# Patient Record
Sex: Male | Born: 1990 | Race: Black or African American | Hispanic: No | Marital: Single | Smoking: Never smoker
Health system: Southern US, Community
[De-identification: ages and names within clinical notes are randomized; demographics above are authoritative.]

## PROBLEM LIST (undated history)

## (undated) HISTORY — PX: ANTERIOR CRUCIATE LIGAMENT REPAIR: SHX115

---
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
## 2011-04-07 LAB — STREP A DNA PROBE: Group A Strep Probe: NEGATIVE

## 2011-04-07 LAB — RAPID STREP SCREEN (MED CTR MEBANE ONLY): Streptococcus, Group A Screen (Direct): NEGATIVE

## 2013-04-11 ENCOUNTER — Encounter (HOSPITAL_COMMUNITY): Payer: Self-pay | Admitting: Emergency Medicine

## 2013-04-11 ENCOUNTER — Emergency Department (HOSPITAL_COMMUNITY)
Admission: EM | Admit: 2013-04-11 | Discharge: 2013-04-11 | Disposition: A | Discharge status: Home / Self Care | Payer: Self-pay | Attending: Emergency Medicine | Admitting: Emergency Medicine

## 2013-04-11 DIAGNOSIS — J069 Acute upper respiratory infection, unspecified: Secondary | ICD-10-CM

## 2013-04-11 DIAGNOSIS — K089 Disorder of teeth and supporting structures, unspecified: Secondary | ICD-10-CM | POA: Insufficient documentation

## 2013-04-11 DIAGNOSIS — K029 Dental caries, unspecified: Secondary | ICD-10-CM

## 2013-04-11 DIAGNOSIS — L299 Pruritus, unspecified: Secondary | ICD-10-CM | POA: Insufficient documentation

## 2013-04-11 MED ORDER — AMOXICILLIN 500 MG PO CAPS: 500.0000 mg | ORAL_CAPSULE | Freq: Three times a day (TID) | ORAL | Status: DC

## 2013-04-11 MED ORDER — PSEUDOEPHEDRINE HCL 60 MG PO TABS: 60.0000 mg | ORAL_TABLET | Freq: Four times a day (QID) | ORAL | Status: DC | PRN

## 2013-04-11 NOTE — ED Notes (Signed)
Awoke today with head congestion.  Also has tooth pain

## 2013-04-11 NOTE — ED Provider Notes (Signed)
CSN: [id number]     Arrival date & time 04/11/13  2132 History   First MD Initiated Contact with Patient 04/11/13 2138     Chief Complaint  Patient presents with  . Nasal Congestion   (Consider location/radiation/quality/duration/timing/severity/associated sxs/prior Treatment) Patient is a 22 y.o. male presenting with URI. The history is provided by the patient.  URI Presenting symptoms: congestion and cough   Presenting symptoms: no fever and no sore throat   Severity:  Mild Onset quality:  Gradual Duration:  12 hours Timing:  Constant Chronicity:  New Relieved by: rest helps some. Worsened by:  Eating Ineffective treatments:  None tried Associated symptoms: myalgias and sneezing   Associated symptoms: no headaches (hx of headache when younger but none in over a year.), no neck pain, no sinus pain and no wheezing   Risk factors: no diabetes mellitus and no sick contacts    AMOL DOMANSKI is a 22 y.o. male who presents to the ED with nasal congestion, cough and a tooth ache. The tooth makes his head hurt. There is a big hole in the right upper tooth and it hurts to ear.  History reviewed. No pertinent past medical history. History reviewed. No pertinent past surgical history. No family history on file. History  Substance Use Topics  . Smoking status: Never Smoker   . Smokeless tobacco: Not on file  . Alcohol Use: Yes     Comment: rarely    Review of Systems  Constitutional: Negative for fever and chills.  HENT: Positive for congestion, sneezing and dental problem. Negative for sore throat and neck pain.   Eyes: Positive for itching. Negative for photophobia and redness.  Respiratory: Positive for cough. Negative for chest tightness and wheezing.   Cardiovascular: Negative for palpitations.  Gastrointestinal: Negative for nausea, vomiting and abdominal pain.  Musculoskeletal: Positive for myalgias.  Skin: Negative for rash.  Allergic/Immunologic: Negative for  immunocompromised state.  Neurological: Negative for syncope and headaches (hx of headache when younger but none in over a year.).  Psychiatric/Behavioral: The patient is not nervous/anxious.     Allergies  Review of patient's allergies indicates no known allergies.  Home Medications   Current Outpatient Rx  Name  Route  Sig  Dispense  Refill  . ibuprofen (ADVIL,MOTRIN) 200 MG tablet   Oral   Take 200 mg by mouth every 6 (six) hours as needed for pain.          BP 118/79  Pulse 79  Temp(Src) 98.6 F (37 C) (Oral)  Resp 18  Ht 5\' 7"  (1.702 m)  Wt 152 lb (68.947 kg)  BMI 23.8 kg/m2  SpO2 99% Physical Exam  Nursing note and vitals reviewed. Constitutional: He is oriented to person, place, and time. He appears well-developed and well-nourished. No distress.  HENT:  Head: Normocephalic and atraumatic.  Right Ear: Tympanic membrane normal.  Left Ear: Tympanic membrane normal.  Nose: Rhinorrhea present.  Mouth/Throat: Uvula is midline, oropharynx is clear and moist and mucous membranes are normal. Dental caries present.    Eyes: EOM are normal.  Neck: Neck supple.  Cardiovascular: Normal rate and regular rhythm.   Pulmonary/Chest: Effort normal and breath sounds normal.  Abdominal: Soft. There is no tenderness.  Musculoskeletal: Normal range of motion.  Neurological: He is alert and oriented to person, place, and time. No cranial nerve deficit.  Skin: Skin is warm and dry.  Psychiatric: He has a normal mood and affect. His behavior is normal.    ED  Course  Procedures  MDM  22 y.o. male with URI and dental pain due to caries. Will treat URI with Sudafed and will treat dental infection with Amoxil. Patient to follow up with the dental clinic as soon as possible. He will return here as needed.    Medication List    TAKE these medications       amoxicillin 500 MG capsule  Commonly known as:  AMOXIL  Take 1 capsule (500 mg total) by mouth 3 (three) times daily.      pseudoephedrine 60 MG tablet  Commonly known as:  SUDAFED  Take 1 tablet (60 mg total) by mouth every 6 (six) hours as needed for congestion.      ASK your doctor about these medications       ibuprofen 200 MG tablet  Commonly known as:  ADVIL,MOTRIN  Take 400 mg by mouth every 6 (six) hours as needed for pain.           Janne Napoleon, [place] 04/11/13 [phone]

## 2013-04-11 NOTE — ED Provider Notes (Signed)
Medical screening examination/treatment/procedure(s) were performed by non-physician practitioner and as supervising physician I was immediately available for consultation/collaboration.    Vida Roller, MD 04/11/13 [phone]

## 2013-05-30 ENCOUNTER — Emergency Department (HOSPITAL_COMMUNITY)
Admission: EM | Admit: 2013-05-30 | Discharge: 2013-05-30 | Disposition: A | Discharge status: Home / Self Care | Payer: Self-pay | Attending: Emergency Medicine | Admitting: Emergency Medicine

## 2013-05-30 ENCOUNTER — Encounter (HOSPITAL_COMMUNITY): Payer: Self-pay | Admitting: Emergency Medicine

## 2013-05-30 DIAGNOSIS — R6884 Jaw pain: Secondary | ICD-10-CM | POA: Insufficient documentation

## 2013-05-30 DIAGNOSIS — T07XXXA Unspecified multiple injuries, initial encounter: Secondary | ICD-10-CM

## 2013-05-30 DIAGNOSIS — Z792 Long term (current) use of antibiotics: Secondary | ICD-10-CM | POA: Insufficient documentation

## 2013-05-30 DIAGNOSIS — IMO0002 Reserved for concepts with insufficient information to code with codable children: Secondary | ICD-10-CM | POA: Insufficient documentation

## 2013-05-30 MED ORDER — NAPROXEN 500 MG PO TABS: 500.0000 mg | ORAL_TABLET | Freq: Two times a day (BID) | ORAL | Status: DC
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
## 2013-05-30 NOTE — ED Notes (Signed)
Pt seen and evaluated by EDPa for initial assessment. 
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
## 2013-06-01 NOTE — ED Provider Notes (Signed)
Medical screening examination/treatment/procedure(s) were performed by non-physician practitioner and as supervising physician I was immediately available for consultation/collaboration.  EKG Interpretation   None         Keasha Malkiewicz L Bogdan Vivona, MD 06/01/13 0800 

## 2013-06-24 ENCOUNTER — Emergency Department (HOSPITAL_COMMUNITY)
Admission: EM | Admit: 2013-06-24 | Discharge: 2013-06-24 | Disposition: A | Discharge status: Home / Self Care | Payer: Self-pay | Attending: Emergency Medicine | Admitting: Emergency Medicine

## 2013-06-24 ENCOUNTER — Encounter (HOSPITAL_COMMUNITY): Payer: Self-pay | Admitting: Emergency Medicine

## 2013-06-24 DIAGNOSIS — R300 Dysuria: Secondary | ICD-10-CM | POA: Insufficient documentation

## 2013-06-24 DIAGNOSIS — R369 Urethral discharge, unspecified: Secondary | ICD-10-CM | POA: Insufficient documentation

## 2013-06-24 DIAGNOSIS — IMO0001 Reserved for inherently not codable concepts without codable children: Secondary | ICD-10-CM | POA: Insufficient documentation

## 2013-06-24 DIAGNOSIS — Z202 Contact with and (suspected) exposure to infections with a predominantly sexual mode of transmission: Secondary | ICD-10-CM | POA: Insufficient documentation

## 2013-06-24 DIAGNOSIS — K089 Disorder of teeth and supporting structures, unspecified: Secondary | ICD-10-CM | POA: Insufficient documentation

## 2013-06-24 DIAGNOSIS — K0889 Other specified disorders of teeth and supporting structures: Secondary | ICD-10-CM

## 2013-06-24 LAB — URINALYSIS, ROUTINE W REFLEX MICROSCOPIC
Bilirubin Urine: NEGATIVE
Glucose, UA: NEGATIVE mg/dL
Hgb urine dipstick: NEGATIVE
Ketones, ur: NEGATIVE mg/dL
Nitrite: NEGATIVE
Specific Gravity, Urine: >1.03 — ABNORMAL HIGH (ref 1.005–1.030)
Urobilinogen, UA: 0.2 mg/dL (ref 0.0–1.0)
pH: 5.5 (ref 5.0–8.0)

## 2013-06-24 LAB — RPR: RPR Ser Ql: NONREACTIVE

## 2013-06-24 LAB — URINE MICROSCOPIC-ADD ON

## 2013-06-24 MED ORDER — LIDOCAINE HCL (PF) 1 % IJ SOLN
INTRAMUSCULAR | Status: AC
  Administered 2013-06-24: 5 mL via INTRAMUSCULAR
  Filled 2013-06-24: qty 5

## 2013-06-24 MED ORDER — METRONIDAZOLE 500 MG PO TABS: 500.0000 mg | ORAL_TABLET | Freq: Three times a day (TID) | ORAL | Status: DC

## 2013-06-24 MED ORDER — AMOXICILLIN 500 MG PO CAPS: 500.0000 mg | ORAL_CAPSULE | Freq: Three times a day (TID) | ORAL | Status: DC

## 2013-06-24 MED ORDER — METRONIDAZOLE 500 MG PO TABS
500.0000 mg | ORAL_TABLET | Freq: Once | ORAL | Status: AC
  Administered 2013-06-24: 500 mg via ORAL
  Filled 2013-06-24: qty 1

## 2013-06-24 MED ORDER — PROCHLORPERAZINE MALEATE 5 MG PO TABS
10.0000 mg | ORAL_TABLET | Freq: Once | ORAL | Status: AC
  Administered 2013-06-24: 10 mg via ORAL
  Filled 2013-06-24: qty 2

## 2013-06-24 MED ORDER — TRAMADOL HCL 50 MG PO TABS: ORAL_TABLET | ORAL | Status: DC

## 2013-06-24 MED ORDER — AZITHROMYCIN 250 MG PO TABS
1000.0000 mg | ORAL_TABLET | Freq: Once | ORAL | Status: AC
  Administered 2013-06-24: 1000 mg via ORAL
  Filled 2013-06-24: qty 4

## 2013-06-24 MED ORDER — CEFTRIAXONE SODIUM 250 MG IJ SOLR
250.0000 mg | Freq: Once | INTRAMUSCULAR | Status: AC
  Administered 2013-06-24: 250 mg via INTRAMUSCULAR
  Filled 2013-06-24: qty 250

## 2013-06-24 NOTE — ED Notes (Signed)
Pt reports having painful discharge with urination this a.m.

## 2013-06-24 NOTE — ED Notes (Signed)
Pt noticed d/c when urinated last night.  Also says he has a tooth ache, rt upper molar for several mos.

## 2013-06-24 NOTE — ED Provider Notes (Signed)
CSN: [id number]     Arrival date & time 06/24/13  0941 History   First MD Initiated Contact with Patient 06/24/13 1018     Chief Complaint  Patient presents with  . SEXUALLY TRANSMITTED DISEASE   (Consider location/radiation/quality/duration/timing/severity/associated sxs/prior Treatment) HPI Comments: Two male friends told pt that he gave them trich. He Reports discharge with urination this AM.  Patient is a 22 y.o. male presenting with penile discharge. The history is provided by the patient.  Penile Discharge This is a new problem. The current episode started yesterday. The problem occurs constantly. The problem has been gradually worsening. Associated symptoms include myalgias. Pertinent negatives include no abdominal pain, arthralgias, chest pain, coughing, fever, neck pain, rash or vomiting. Exacerbated by: urination. He has tried nothing for the symptoms. The treatment provided moderate relief.    History reviewed. No pertinent past medical history. History reviewed. No pertinent past surgical history. History reviewed. No pertinent family history. History  Substance Use Topics  . Smoking status: Never Smoker   . Smokeless tobacco: Not on file  . Alcohol Use: No     Comment: rarely    Review of Systems  Constitutional: Negative for fever and activity change.       All ROS Neg except as noted in HPI  HENT: Negative for nosebleeds.   Eyes: Negative for photophobia and discharge.  Respiratory: Negative for cough, shortness of breath and wheezing.   Cardiovascular: Negative for chest pain and palpitations.  Gastrointestinal: Negative for vomiting, abdominal pain and blood in stool.  Genitourinary: Positive for discharge. Negative for dysuria, frequency and hematuria.  Musculoskeletal: Positive for myalgias. Negative for arthralgias, back pain and neck pain.  Skin: Negative.  Negative for rash.  Neurological: Negative for dizziness, seizures and speech difficulty.    Psychiatric/Behavioral: Negative for hallucinations and confusion.    Allergies  Review of patient's allergies indicates no known allergies.  Home Medications  No current outpatient prescriptions on file. BP 100/65  Pulse 70  Temp(Src) 98.3 F (36.8 C) (Oral)  Resp 18  Ht 5\' 7"  (1.702 m)  Wt 150 lb (68.04 kg)  BMI 23.49 kg/m2  SpO2 100% Physical Exam  Nursing note and vitals reviewed. Constitutional: He is oriented to person, place, and time. He appears well-developed and well-nourished.  Non-toxic appearance.  HENT:  Head: Normocephalic.  Right Ear: Tympanic membrane and external ear normal.  Left Ear: Tympanic membrane and external ear normal.  Mouth/Throat:    Eyes: EOM and lids are normal. Pupils are equal, round, and reactive to light.  Neck: Normal range of motion. Neck supple. Carotid bruit is not present.  Cardiovascular: Normal rate, regular rhythm, normal heart sounds, intact distal pulses and normal pulses.   Pulmonary/Chest: Breath sounds normal. No respiratory distress.  Abdominal: Soft. Bowel sounds are normal. There is no tenderness. There is no guarding. Hernia confirmed negative in the right inguinal area and confirmed negative in the left inguinal area.  Genitourinary: Testes normal. Circumcised. Discharge found.  Musculoskeletal: Normal range of motion.  Lymphadenopathy:       Head (right side): No submandibular adenopathy present.       Head (left side): No submandibular adenopathy present.    He has no cervical adenopathy.  Neurological: He is alert and oriented to person, place, and time. He has normal strength. No cranial nerve deficit or sensory deficit.  Skin: Skin is warm and dry.  Psychiatric: He has a normal mood and affect. His speech is normal.  ED Course  Procedures (including critical care time) Labs Review Labs Reviewed  URINALYSIS, ROUTINE W REFLEX MICROSCOPIC - Abnormal; Notable for the following:    Specific Gravity, Urine >1.030  (*)    Protein, ur TRACE (*)    Leukocytes, UA TRACE (*)    All other components within normal limits  URINE MICROSCOPIC-ADD ON   Imaging Review No results found.  EKG Interpretation   None       MDM  No diagnosis found. **I have reviewed nursing notes, vital signs, and all appropriate lab and imaging results for this patient.*  Urethral culture sent to the lab. Pt treated with Rocephin and zithromax and flagyl. Rx for tramadol and flagyl given to the patient. Dental resources given to pt. Pt [facility] with additional testing.  Kathie Dike, PA-C 06/24/13 2106

## 2013-06-25 LAB — GC/CHLAMYDIA PROBE AMP
CT Probe RNA: POSITIVE — AB
GC Probe RNA: NEGATIVE

## 2013-06-26 NOTE — ED Provider Notes (Signed)
Medical screening examination/treatment/procedure(s) were performed by non-physician practitioner and as supervising physician I was immediately available for consultation/collaboration.  EKG Interpretation   None         Drinda Belgard W. Dakisha Schoof, MD 06/26/13 1524 

## 2013-06-27 NOTE — ED Notes (Signed)
+   Chlamydia Patient treated with Rocephin And Zithromax-[facility] faxed 

## 2013-06-28 ENCOUNTER — Telehealth (HOSPITAL_COMMUNITY): Payer: Self-pay | Admitting: Emergency Medicine

## 2014-01-06 ENCOUNTER — Encounter (HOSPITAL_COMMUNITY): Payer: Self-pay | Admitting: Emergency Medicine

## 2014-01-06 ENCOUNTER — Emergency Department (HOSPITAL_COMMUNITY)
Admission: EM | Admit: 2014-01-06 | Discharge: 2014-01-06 | Disposition: A | Discharge status: Home / Self Care | Payer: Self-pay | Attending: Emergency Medicine | Admitting: Emergency Medicine

## 2014-01-06 DIAGNOSIS — Z792 Long term (current) use of antibiotics: Secondary | ICD-10-CM | POA: Insufficient documentation

## 2014-01-06 DIAGNOSIS — M25561 Pain in right knee: Secondary | ICD-10-CM

## 2014-01-06 DIAGNOSIS — W57XXXA Bitten or stung by nonvenomous insect and other nonvenomous arthropods, initial encounter: Secondary | ICD-10-CM

## 2014-01-06 DIAGNOSIS — G8929 Other chronic pain: Secondary | ICD-10-CM | POA: Insufficient documentation

## 2014-01-06 DIAGNOSIS — S90569A Insect bite (nonvenomous), unspecified ankle, initial encounter: Secondary | ICD-10-CM | POA: Insufficient documentation

## 2014-01-06 DIAGNOSIS — Z79899 Other long term (current) drug therapy: Secondary | ICD-10-CM | POA: Insufficient documentation

## 2014-01-06 DIAGNOSIS — Y939 Activity, unspecified: Secondary | ICD-10-CM | POA: Insufficient documentation

## 2014-01-06 DIAGNOSIS — Y929 Unspecified place or not applicable: Secondary | ICD-10-CM | POA: Insufficient documentation

## 2014-01-06 NOTE — ED Notes (Signed)
Pt reports ? Spider bite to back of rt heel. Small knot present to back of heel. C/o tenderness to touch.

## 2014-01-06 NOTE — Discharge Instructions (Signed)
Insect Bite Mosquitoes, flies, fleas, bedbugs, and many other insects can bite. Insect bites are different from insect stings. A sting is when venom is injected into the skin. Some insect bites can transmit infectious diseases. SYMPTOMS  Insect bites usually turn red, swell, and itch for 2 to 4 days. They often go away on their own. TREATMENT  Your caregiver may prescribe antibiotic medicines if a bacterial infection develops in the bite. HOME CARE INSTRUCTIONS  Do not scratch the bite area.  Keep the bite area clean and dry. Wash the bite area thoroughly with soap and water.  Put ice or cool compresses on the bite area.  Put ice in a plastic bag.  Place a towel between your skin and the bag.  Leave the ice on for 20 minutes, 4 times a day for the first 2 to 3 days, or as directed.  You may apply a baking soda paste, cortisone cream, or calamine lotion to the bite area as directed by your caregiver. This can help reduce itching and swelling.  Only take over-the-counter or prescription medicines as directed by your caregiver.  If you are given antibiotics, take them as directed. Finish them even if you start to feel better. You may need a tetanus shot if:  You cannot remember when you had your last tetanus shot.  You have never had a tetanus shot.  The injury broke your skin. If you get a tetanus shot, your arm may swell, get red, and feel warm to the touch. This is common and not a problem. If you need a tetanus shot and you choose not to have one, there is a rare chance of getting tetanus. Sickness from tetanus can be serious. SEEK IMMEDIATE MEDICAL CARE IF:   You have increased pain, redness, or swelling in the bite area.  You see a red line on the skin coming from the bite.  You have a fever.  You have joint pain.  You have a headache or neck pain.  You have unusual weakness.  You have a rash.  You have chest pain or shortness of breath.  You have abdominal pain,  nausea, or vomiting.  You feel unusually tired or sleepy. MAKE SURE YOU:   Understand these instructions.  Will watch your condition.  Will get help right away if you are not doing well or get worse. Document Released: 08/11/2004 Document Revised: 09/26/2011 Document Reviewed: 02/02/2011 [facility] LP[facility] Patient Information 2015 [employer] [place], [place]. This information is not intended to replace advice given to you by your health care provider. Make sure you discuss any questions you have with your health care provider.   You may take benadryl as discussed if you continue to have itching at the site of this possible insect bite.  Ice packs will also help with itching.  There is no sign of infection at this time,  But applying an antibiotic ointment to the area twice daily may be soothing and help prevent infection (no prescription needed - use triple antibiotic ointment or neosporin).    Call Dr. Hilda LiasKeeling for further evaluation of your chronic knee problem.

## 2014-01-06 NOTE — ED Notes (Signed)
nad noted prior to dc. Dc instructions reviewed and explained. Voiced understanding. Ambulated out without difficulty.  

## 2014-01-07 NOTE — ED Provider Notes (Signed)
CSN: [id number]     Arrival date & time 01/06/14  0910 History   First MD Initiated Contact with Patient 01/06/14 [phone]     Chief Complaint  Patient presents with  . Insect Bite     (Consider location/radiation/quality/duration/timing/severity/associated sxs/prior Treatment) The history is provided by the patient.    Donald Woodward is a 23 y.o. male presenting with a tender but also itchy swelling to his right heel which he first noticed when he started his shift at work prior to arrival and is concerned for possible insect bite.  He works in a [employer] and denies seeing or feeling a bite.  It is painful with palpation and with rubbing against his shoe. He denies fevers, there has been no drainage from this site.  Additionally, he has complaint of intermittent right knee pain and swelling which tends to be worsened after a long shift at work, so is not bothering him currently.  He reports popping in the knee joint at times, especially when climbing steps.  He denies injury and has not seen a doctor for this complaint which has been present for "several years". He has found no alleviators.       History reviewed. No pertinent past medical history. History reviewed. No pertinent past surgical history. No family history on file. History  Substance Use Topics  . Smoking status: Never Smoker   . Smokeless tobacco: Not on file  . Alcohol Use: No     Comment: rarely    Review of Systems  Constitutional: Negative for fever and chills.  Respiratory: Negative for shortness of breath and wheezing.   Gastrointestinal: Negative for nausea and vomiting.  Musculoskeletal: Positive for arthralgias and joint swelling. Negative for myalgias.  Skin: Positive for wound.  Neurological: Negative for weakness and numbness.      Allergies  Review of patient's allergies indicates no known allergies.  Home Medications   Prior to Admission medications   Medication Sig Start Date End Date  Taking? Authorizing Provider  amoxicillin (AMOXIL) 500 MG capsule Take 1 capsule (500 mg total) by mouth 3 (three) times daily. 06/24/13   Kathie DikeHobson M Bryant, PA-C  metroNIDAZOLE (FLAGYL) 500 MG tablet Take 1 tablet (500 mg total) by mouth 3 (three) times daily. 06/24/13   Kathie DikeHobson M Bryant, PA-C  traMADol (ULTRAM) 50 MG tablet 1 or 2 po q6h prn pain 06/24/13   Kathie DikeHobson M Bryant, PA-C   BP 143/77  Pulse 70  Temp(Src) 98.1 F (36.7 C) (Oral)  Resp 16  Wt 150 lb (68.04 kg)  SpO2 97% Physical Exam  Constitutional: He appears well-developed and well-nourished.  HENT:  Head: Atraumatic.  Neck: Normal range of motion.  Cardiovascular:  Pulses:      Dorsalis pedis pulses are 2+ on the right side, and 2+ on the left side.  Pulses equal bilaterally  Musculoskeletal: He exhibits tenderness. He exhibits no edema.       Right knee: He exhibits bony tenderness. He exhibits normal range of motion, no swelling, no effusion, no deformity, no erythema, normal alignment, no LCL laxity, normal meniscus and no MCL laxity. No tenderness found.  ttp right patella.  He does have mild crepitus with ranging the joint into full extension. No edema, no effusion.  No ligament instability.    Neurological: He is alert. He has normal strength. He displays normal reflexes. No sensory deficit.  Skin: Skin is warm and dry.  Small callous like raised lesion left posterior heel. No erythema, no  fluctuance, no red streaking.  Achilles intact.   Psychiatric: He has a normal mood and affect.    ED Course  Procedures (including critical care time) Labs Review Labs Reviewed - No data to display  Imaging Review No results found.   EKG Interpretation None      MDM   Final diagnoses:  Insect bite  Chronic knee pain, right    Possible insect bite, but no evidence of erythema or necrosis.  Chronic right knee pain with mild crepitus, no known injury.  No indication for xrays today. He was encouraged to apply abx ointment  to the heel lesion bid,  Benadryl prn itching.  Cool compresses.  Return for any worsened sx such as redness, pain,  Increased swelling.  Referred to ortho for evaluation of chronic knee pain.      Burgess AmorJulie Idol, PA-C 01/07/14 1739

## 2014-01-09 NOTE — ED Provider Notes (Signed)
Medical screening examination/treatment/procedure(s) were performed by non-physician practitioner and as supervising physician I was immediately available for consultation/collaboration.   EKG Interpretation None       Nathan R. Pickering, MD 01/09/14 1452 

## 2015-03-08 ENCOUNTER — Encounter (HOSPITAL_COMMUNITY): Payer: Self-pay | Admitting: Emergency Medicine

## 2015-03-08 ENCOUNTER — Emergency Department (HOSPITAL_COMMUNITY)
Admission: EM | Admit: 2015-03-08 | Discharge: 2015-03-09 | Disposition: A | Discharge status: Home / Self Care | Payer: BLUE CROSS/BLUE SHIELD | Attending: Emergency Medicine | Admitting: Emergency Medicine

## 2015-03-08 DIAGNOSIS — K029 Dental caries, unspecified: Secondary | ICD-10-CM

## 2015-03-08 DIAGNOSIS — K088 Other specified disorders of teeth and supporting structures: Secondary | ICD-10-CM | POA: Diagnosis present

## 2015-03-08 MED ORDER — TRAMADOL HCL 50 MG PO TABS
50.0000 mg | ORAL_TABLET | Freq: Once | ORAL | Status: AC
  Administered 2015-03-08: 50 mg via ORAL
  Filled 2015-03-08: qty 1

## 2015-03-08 MED ORDER — TRAMADOL HCL 50 MG PO TABS: 50.0000 mg | ORAL_TABLET | Freq: Four times a day (QID) | ORAL | Status: DC | PRN

## 2015-03-08 NOTE — ED Provider Notes (Signed)
CSN: [id number]     Arrival date & time 03/08/15  2205 History   First MD Initiated Contact with Patient 03/08/15 2302     Chief Complaint  Patient presents with  . Dental Pain     (Consider location/radiation/quality/duration/timing/severity/associated sxs/prior Treatment) The history is provided by the patient.   BRECCAN GALANT is a 24 y.o. male with known dental cavities involving his left upper premolars causing increased pain today, but with chronic pain for weeks.  He has been seen by a dentist [facility]) one month ago and told he would need a root canal for repair but this has not yet been arranged.  He also states has missed 2 f/u appts with them due to transportation issues and may be dropped as a patient.  He denies fevers, chills, difficulty swallowing.  He has taken no medicines for pain today but has tried tylenol and motrin without relief of sx.  He denies swelling of gums, face or drainage from around the teeth.     History reviewed. No pertinent past medical history. History reviewed. No pertinent past surgical history. History reviewed. No pertinent family history. Social History  Substance Use Topics  . Smoking status: Never Smoker   . Smokeless tobacco: None  . Alcohol Use: No     Comment: rarely    Review of Systems  Constitutional: Negative for fever.  HENT: Positive for dental problem. Negative for facial swelling and sore throat.   Respiratory: Negative for shortness of breath.   Musculoskeletal: Negative for neck pain and neck stiffness.      Allergies  Review of patient's allergies indicates no known allergies.  Home Medications   Prior to Admission medications   Medication Sig Start Date End Date Taking? Authorizing Provider  traMADol (ULTRAM) 50 MG tablet Take 1 tablet (50 mg total) by mouth every 6 (six) hours as needed. 03/08/15   Burgess Amor, PA-C   BP 141/84 mmHg  Pulse 74  Temp(Src) 98.2 F (36.8 C) (Oral)  Resp 16  Ht 5'  7" (1.702 m)  Wt 152 lb (68.947 kg)  BMI 23.80 kg/m2  SpO2 100% Physical Exam  Constitutional: He is oriented to person, place, and time. He appears well-developed and well-nourished. No distress.  HENT:  Head: Normocephalic and atraumatic.  Right Ear: Tympanic membrane and external ear normal.  Left Ear: Tympanic membrane and external ear normal.  Mouth/Throat: Oropharynx is clear and moist and mucous membranes are normal. No oral lesions. No trismus in the jaw. Dental caries present. No dental abscesses.    Eyes: Conjunctivae are normal.  Neck: Normal range of motion. Neck supple.  Cardiovascular: Normal rate and normal heart sounds.   Pulmonary/Chest: Effort normal.  Abdominal: He exhibits no distension.  Musculoskeletal: Normal range of motion.  Lymphadenopathy:    He has no cervical adenopathy.  Neurological: He is alert and oriented to person, place, and time.  Skin: Skin is warm and dry. No erythema.  Psychiatric: He has a normal mood and affect.    ED Course  Procedures (including critical care time) Labs Review Labs Reviewed - No data to display  Imaging Review No results found. I have personally reviewed and evaluated these images and lab results as part of my medical decision-making.   EKG Interpretation None      MDM   Final diagnoses:  Dental cavity    Pt encouraged f/u with dentistry.  Referrals to local [place] dentists given.  Tramadol prescribed.  No sign  of dental infection or abscess.     Burgess Amor, PA-C 03/08/15 1610  Loren Racer, MD 03/09/15 [id number]

## 2015-03-08 NOTE — ED Notes (Signed)
Patient complaining of dental pain to left upper. Reports has been bothering him for a while now, but is worse today.

## 2015-03-08 NOTE — Discharge Instructions (Signed)
Dental Caries  Dental caries (also called tooth decay) is the most common oral disease. It can occur at any age but is more common in children and young adults.   HOW DENTAL CARIES DEVELOPS   The process of decay begins when bacteria and foods (particularly sugars and starches) combine in your mouth to produce plaque. Plaque is a substance that sticks to the hard, outer surface of a tooth (enamel). The bacteria in plaque produce acids that attack enamel. These acids may also attack the root surface of a tooth (cementum) if it is exposed. Repeated attacks dissolve these surfaces and create holes in the tooth (cavities). If left untreated, the acids destroy the other layers of the tooth.   RISK FACTORS   Frequent sipping of sugary beverages.    Frequent snacking on sugary and starchy foods, especially those that easily get stuck in the teeth.    Poor oral hygiene.    Dry mouth.    Substance abuse such as methamphetamine abuse.    Broken or poor-fitting dental restorations.    Eating disorders.    Gastroesophageal reflux disease (GERD).    Certain radiation treatments to the head and neck.  SYMPTOMS  In the early stages of dental caries, symptoms are seldom present. Sometimes white, chalky areas may be seen on the enamel or other tooth layers. In later stages, symptoms may include:   Pits and holes on the enamel.   Toothache after sweet, hot, or cold foods or drinks are consumed.   Pain around the tooth.   Swelling around the tooth.  DIAGNOSIS   Most of the time, dental caries is detected during a regular dental checkup. A diagnosis is made after a thorough medical and dental history is taken and the surfaces of your teeth are checked for signs of dental caries. Sometimes special instruments, such as lasers, are used to check for dental caries. Dental X-ray exams may be taken so that areas not visible to the eye (such as between the contact areas of the teeth) can be checked for cavities.    TREATMENT   If dental caries is in its early stages, it may be reversed with a fluoride treatment or an application of a remineralizing agent at the dental office. Thorough brushing and flossing at home is needed to aid these treatments. If it is in its later stages, treatment depends on the location and extent of tooth destruction:    If a small area of the tooth has been destroyed, the destroyed area will be removed and cavities will be filled with a material such as gold, silver amalgam, or composite resin.    If a large area of the tooth has been destroyed, the destroyed area will be removed and a cap (crown) will be fitted over the remaining tooth structure.    If the center part of the tooth (pulp) is affected, a procedure called a root canal will be needed before a filling or crown can be placed.    If most of the tooth has been destroyed, the tooth may need to be pulled (extracted).  HOME CARE INSTRUCTIONS  You can prevent, stop, or reverse dental caries at home by practicing good oral hygiene. Good oral hygiene includes:   Thoroughly cleaning your teeth at least twice a day with a toothbrush and dental floss.    Using a fluoride toothpaste. A fluoride mouth rinse may also be used if recommended by your dentist or health care provider.    Restricting   the amount of sugary and starchy foods and sugary liquids you consume.    Avoiding frequent snacking on these foods and sipping of these liquids.    Keeping regular visits with a dentist for checkups and cleanings.  PREVENTION    Practice good oral hygiene.   Consider a dental sealant. A dental sealant is a coating material that is applied by your dentist to the pits and grooves of teeth. The sealant prevents food from being trapped in them. It may protect the teeth for several years.   Ask about fluoride supplements if you live in a community without fluorinated water or with water that has a low fluoride content. Use fluoride supplements  as directed by your dentist or health care provider.   Allow fluoride varnish applications to teeth if directed by your dentist or health care provider.  Document Released: 03/26/2002 Document Revised: 11/18/2013 Document Reviewed: 07/06/2012  Exit[employer] Patient Information 2015 [employer]. This information is not intended to replace advice given to you by your health care provider. Make sure you discuss any questions you have with your health care provider.

## 2015-09-03 ENCOUNTER — Encounter (HOSPITAL_COMMUNITY): Payer: Self-pay | Admitting: Emergency Medicine

## 2015-09-03 ENCOUNTER — Emergency Department (HOSPITAL_COMMUNITY)
Admission: EM | Admit: 2015-09-03 | Discharge: 2015-09-03 | Disposition: A | Discharge status: Home / Self Care | Payer: BLUE CROSS/BLUE SHIELD | Attending: Emergency Medicine | Admitting: Emergency Medicine

## 2015-09-03 DIAGNOSIS — A6002 Herpesviral infection of other male genital organs: Secondary | ICD-10-CM | POA: Insufficient documentation

## 2015-09-03 MED ORDER — ACYCLOVIR 400 MG PO TABS: 400.0000 mg | ORAL_TABLET | Freq: Every day | ORAL | Status: DC

## 2015-09-03 NOTE — Discharge Instructions (Signed)

## 2015-09-03 NOTE — ED Provider Notes (Signed)
CSN: [id number]     Arrival date & time 09/03/15  1150 History   First MD Initiated Contact with Patient 09/03/15 1234     Chief Complaint  Patient presents with  . Rash     (Consider location/radiation/quality/duration/timing/severity/associated sxs/prior Treatment) Patient is a 25 y.o. male presenting with rash. The history is provided by the patient. No language interpreter was used.  Rash Location: penis. Quality: blistering   Severity:  Moderate Timing:  Constant Progression:  Worsening Chronicity:  New Relieved by:  Nothing Ineffective treatments:  None tried   History reviewed. No pertinent past medical history. History reviewed. No pertinent past surgical history. History reviewed. No pertinent family history. Social History  Substance Use Topics  . Smoking status: Never Smoker   . Smokeless tobacco: None  . Alcohol Use: No     Comment: rarely    Review of Systems  Skin: Positive for rash.  All other systems reviewed and are negative.     Allergies  Review of patient's allergies indicates no known allergies.  Home Medications   Prior to Admission medications   Medication Sig Start Date End Date Taking? Authorizing Provider  acyclovir (ZOVIRAX) 400 MG tablet Take 1 tablet (400 mg total) by mouth 5 (five) times daily. 09/03/15   Elson Areas, PA-C  traMADol (ULTRAM) 50 MG tablet Take 1 tablet (50 mg total) by mouth every 6 (six) hours as needed. 03/08/15   Burgess Amor, PA-C   BP 134/84 mmHg  Pulse 71  Temp(Src) 98 F (36.7 C) (Oral)  Resp 16  Ht  (1.727 m)  Wt 72.576 kg  BMI 24.33 kg/m2  SpO2 97% Physical Exam  Constitutional: He is oriented to person, place, and time. He appears well-developed and well-nourished.  HENT:  Head: Normocephalic.  Cardiovascular: Normal rate.   Pulmonary/Chest: Effort normal.  Musculoskeletal: Normal range of motion.  Open painful lesions around distal penis,   Neurological: He is alert and oriented to person,  place, and time. He has normal reflexes.  Skin: There is erythema.  Psychiatric: He has a normal mood and affect.  Nursing note and vitals reviewed.   ED Course  Procedures (including critical care time) Labs Review Labs Reviewed  HERPES SIMPLEX VIRUS CULTURE  GC/CHLAMYDIA PROBE AMP ([place]) NOT AT [facility]    Imaging Review No results found. I have personally reviewed and evaluated these images and lab results as part of my medical decision-making.   EKG Interpretation None      MDM I advised pt  I am concerned about herpes.  I will treat with acyclovir.  (pt has had a negative hiv test in the last 6 months.    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    Lonia Skinner Greybull, PA-C 09/03/15 1344  Mancel Bale, MD 09/03/15 [id number]

## 2015-09-03 NOTE — ED Notes (Signed)
Rash to head of penis for one week.  Rates pain 10/10.

## 2015-09-04 LAB — GC/CHLAMYDIA PROBE AMP (~~LOC~~) NOT AT ARMC
CHLAMYDIA, DNA PROBE: NEGATIVE
Neisseria Gonorrhea: NEGATIVE

## 2015-09-07 LAB — HERPES SIMPLEX VIRUS CULTURE: CULTURE: DETECTED

## 2015-09-08 ENCOUNTER — Telehealth (HOSPITAL_BASED_OUTPATIENT_CLINIC_OR_DEPARTMENT_OTHER): Payer: Self-pay | Admitting: Emergency Medicine

## 2015-09-08 NOTE — Telephone Encounter (Signed)
Post ED Visit - Positive Culture Follow-up  Culture report reviewed by antimicrobial stewardship pharmacist:   Enzo Bi, Pharm.D.  Celedonio Miyamoto, Pharm.D., BCPS  Garvin Fila, Pharm.D.  Georgina Pillion, Pharm.D., BCPS  [place], [address].D., BCPS, AAHIVP  Estella Husk, Pharm.D., BCPS, AAHIVP  Cassie Stewart, Pharm.D.  Sherle Poe, [place].D.  Positive HSV culture Treated with acyclovir,  organism sensitive to the same and no further patient follow-up is required at this time.  Berle Mull 09/08/2015, 9:31 AM

## 2016-07-22 ENCOUNTER — Emergency Department (HOSPITAL_COMMUNITY)
Admission: EM | Admit: 2016-07-22 | Discharge: 2016-07-22 | Disposition: A | Discharge status: Home / Self Care | Payer: No Typology Code available for payment source | Attending: Emergency Medicine | Admitting: Emergency Medicine

## 2016-07-22 ENCOUNTER — Encounter (HOSPITAL_COMMUNITY): Payer: Self-pay

## 2016-07-22 ENCOUNTER — Emergency Department (HOSPITAL_COMMUNITY): Payer: No Typology Code available for payment source

## 2016-07-22 DIAGNOSIS — S30810A Abrasion of lower back and pelvis, initial encounter: Secondary | ICD-10-CM | POA: Diagnosis not present

## 2016-07-22 DIAGNOSIS — Y9389 Activity, other specified: Secondary | ICD-10-CM | POA: Diagnosis not present

## 2016-07-22 DIAGNOSIS — S6992XA Unspecified injury of left wrist, hand and finger(s), initial encounter: Secondary | ICD-10-CM | POA: Diagnosis present

## 2016-07-22 DIAGNOSIS — Y999 Unspecified external cause status: Secondary | ICD-10-CM | POA: Insufficient documentation

## 2016-07-22 DIAGNOSIS — Y92410 Unspecified street and highway as the place of occurrence of the external cause: Secondary | ICD-10-CM | POA: Diagnosis not present

## 2016-07-22 DIAGNOSIS — B070 Plantar wart: Secondary | ICD-10-CM | POA: Insufficient documentation

## 2016-07-22 DIAGNOSIS — S52502A Unspecified fracture of the lower end of left radius, initial encounter for closed fracture: Secondary | ICD-10-CM | POA: Diagnosis not present

## 2016-07-22 MED ORDER — IBUPROFEN 800 MG PO TABS: 800.0000 mg | ORAL_TABLET | Freq: Three times a day (TID) | ORAL | 0 refills | Status: DC

## 2016-07-22 MED ORDER — IBUPROFEN 800 MG PO TABS
800.0000 mg | ORAL_TABLET | Freq: Once | ORAL | Status: AC
  Administered 2016-07-22: 800 mg via ORAL
  Filled 2016-07-22: qty 1

## 2016-07-22 MED ORDER — HYDROCODONE-ACETAMINOPHEN 5-325 MG PO TABS
1.0000 | ORAL_TABLET | Freq: Once | ORAL | Status: AC
  Administered 2016-07-22: 1 via ORAL
  Filled 2016-07-22: qty 1

## 2016-07-22 MED ORDER — HYDROCODONE-ACETAMINOPHEN 5-325 MG PO TABS: ORAL_TABLET | ORAL | 0 refills | Status: DC
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
## 2016-07-22 NOTE — ED Notes (Signed)
Pt alert & oriented x4, stable gait. Patient given discharge instructions, paperwork & prescription(s). Patient informed not to drive, operate any equipment & handel any important documents 4 hours after taking pain medication. Patient  instructed to stop at the registration desk to finish any additional paperwork. Patient  verbalized understanding. Pt left department w/ no further questions. 

## 2016-07-22 NOTE — Discharge Instructions (Signed)
Keep your wrist splinted and elevated when possible.  Call the orthopedic doctor listed (Dr. Romeo AppleHarrison) on Monday to arrange a follow-up appt. You can also arrange a follow-up with the foot doctor if needed

## 2019-01-29 ENCOUNTER — Emergency Department (HOSPITAL_COMMUNITY): Payer: Self-pay

## 2019-01-29 ENCOUNTER — Encounter (HOSPITAL_COMMUNITY): Payer: Self-pay

## 2019-01-29 ENCOUNTER — Other Ambulatory Visit: Payer: Self-pay

## 2019-01-29 ENCOUNTER — Emergency Department (HOSPITAL_COMMUNITY)
Admission: EM | Admit: 2019-01-29 | Discharge: 2019-01-29 | Disposition: A | Discharge status: Home / Self Care | Payer: Self-pay | Attending: Emergency Medicine | Admitting: Emergency Medicine

## 2019-01-29 DIAGNOSIS — M25562 Pain in left knee: Secondary | ICD-10-CM | POA: Insufficient documentation

## 2019-01-29 MED ORDER — ACETAMINOPHEN 500 MG PO TABS: 500.0000 mg | ORAL_TABLET | Freq: Four times a day (QID) | ORAL | 0 refills | Status: DC | PRN

## 2019-01-29 MED ORDER — IBUPROFEN 600 MG PO TABS: 600.0000 mg | ORAL_TABLET | Freq: Four times a day (QID) | ORAL | 0 refills | Status: DC | PRN

## 2019-01-29 NOTE — ED Provider Notes (Signed)
[facility] EMERGENCY DEPARTMENT Provider Note   CSN: [id number] Arrival date & time: 01/29/19  1246    History   Chief Complaint Chief Complaint  Patient presents with  . Knee Pain    HPI Donald Woodward is a 28 y.o. male presents for evaluation of acute onset, persistent left knee pain secondary to injury 4 days ago.  He reports that while playing football he "came down on it wrong" and heard a "pop ".  He notes immediate onset of sharp pains primarily to the superior aspect of the left knee which will radiate into the knee joint at times.  He reports that he will develop some numbness and tingling of his left lower extremity with prolonged standing but otherwise has no paresthesias.  Pain worsens with flexion of the knee and attempts to ambulate.  Denies fever.  He took ibuprofen with some improvement in his symptoms.  No head injury or loss of consciousness.     The history is provided by the patient.    History reviewed. No pertinent past medical history.  There are no active problems to display for this patient.   History reviewed. No pertinent surgical history.      Home Medications    Prior to Admission medications   Medication Sig Start Date End Date Taking? Authorizing Provider  acetaminophen (TYLENOL) 500 MG tablet Take 1 tablet (500 mg total) by mouth every 6 (six) hours as needed. 01/29/19   Tzippy Testerman A, PA-C  ibuprofen (ADVIL) 600 MG tablet Take 1 tablet (600 mg total) by mouth every 6 (six) hours as needed. 01/29/19   Renita Papa, PA-C    Family History No family history on file.  Social History Social History   Tobacco Use  . Smoking status: Never Smoker  . Smokeless tobacco: Never Used  Substance Use Topics  . Alcohol use: No    Comment: rarely  . Drug use: No     Allergies   Patient has no known allergies.   Review of Systems Review of Systems  Constitutional: Negative for fever.  Musculoskeletal: Positive for arthralgias and joint  swelling.  Neurological: Negative for syncope and weakness.     Physical Exam Updated Vital Signs BP 120/78 (BP Location: Right Arm)   Pulse 71   Temp 98.2 F (36.8 C) (Oral)   Resp 16   Ht 5\' 8"  (1.727 m)   Wt 77.1 kg   SpO2 97%   BMI 25.85 kg/m   Physical Exam Vitals signs and nursing note reviewed.  Constitutional:      General: He is not in acute distress.    Appearance: He is well-developed.  HENT:     Head: Normocephalic and atraumatic.  Eyes:     General:        Right eye: No discharge.        Left eye: No discharge.     Conjunctiva/sclera: Conjunctivae normal.  Neck:     Vascular: No JVD.     Trachea: No tracheal deviation.  Cardiovascular:     Rate and Rhythm: Normal rate.     Pulses: Normal pulses.     Comments: 2+ DP/PT pulses bilaterally. Pulmonary:     Effort: Pulmonary effort is normal.  Abdominal:     General: There is no distension.  Musculoskeletal:        General: Tenderness present.     Comments: Mild swelling to the anterior aspect of the left knee.  He has tenderness to  palpation along the medial joint line and the MCL.  Normal active range of motion with flexion.  No varus or valgus instability, no ligamentous laxity, negative anterior/posterior drawer test.  5/5 strength of BLE major muscle groups.  Compartments are soft.  He is able to extend his left lower extremity at the knee against gravity without difficulty.  No quadriceps tendon deformity.  Skin:    General: Skin is warm and dry.     Findings: No erythema.  Neurological:     Mental Status: He is alert.     Comments: Fluent speech, no facial droop, sensation intact to soft touch of bilateral lower extremities.  Ambulatory without difficulty, mildly antalgic gait  Psychiatric:        Behavior: Behavior normal.      ED Treatments / Results  Labs (all labs ordered are listed, but only abnormal results are displayed) Labs Reviewed - No data to display  EKG None  Radiology Dg  Knee Complete 4 Views Left  Result Date: 01/29/2019 CLINICAL DATA:  Left knee pain and swelling EXAM: LEFT KNEE - COMPLETE 4+ VIEW COMPARISON:  None. FINDINGS: No evidence of fracture or malalignment. No evidence of arthropathy or other focal bone abnormality. Small knee joint effusion, nonspecific. Soft tissues are unremarkable. IMPRESSION: Small nonspecific knee joint effusion. No acute osseous abnormality. Electronically Signed   By: Duanne GuessNicholas  Plundo M.D.   On: 01/29/2019 13:38    Procedures Procedures (including critical care time)  Medications Ordered in ED Medications - No data to display   Initial Impression / Assessment and Plan / ED Course  I have reviewed the triage vital signs and the nursing notes.  Pertinent labs & imaging results that were available during my care of the patient were reviewed by me and considered in my medical decision making (see chart for details).        Patient with left knee pain after injury 4 days ago.  He is afebrile, vital signs are stable.  He is nontoxic in appearance.  He is neurovascularly intact, ambulatory without difficulty despite pain.  Radiographs show small nonspecific knee joint effusion but no acute osseous abnormality.  Compartments are soft, no concern for compartment syndrome or DVT.  No concern for septic arthritis in the absence of constitutional symptoms and with good range of motion.  Conservative therapy indicated and discussed with patient.  Will give knee immobilizer and crutches for comfort.  RICE therapy discussed with patient.  Recommend follow-up with orthopedist for reevaluation of symptoms and for evaluation possible ligamentous injury or missed fracture diagnosis.  Discussed strict ED return precautions. Patient verbalized understanding of and agreement with plan and is safe for discharge home at this time.   Final Clinical Impressions(s) / ED Diagnoses   Final diagnoses:  Acute pain of left knee    ED Discharge Orders          Ordered    ibuprofen (ADVIL) 600 MG tablet  Every 6 hours PRN     01/29/19 1540    acetaminophen (TYLENOL) 500 MG tablet  Every 6 hours PRN     01/29/19 1540           Jeanie SewerFawze, Braley Luckenbaugh A, PA-C 01/29/19 1543    Maia PlanLong, Joshua G, MD 01/30/19 [phone]

## 2019-01-29 NOTE — ED Triage Notes (Signed)
Pt's knee is swollen after "coming down on it wrong" while playing football. Ambulatory to triage.

## 2019-01-29 NOTE — Discharge Instructions (Signed)
1. Medications: Alternate 600 mg of ibuprofen and [id number] mg of Tylenol every 3 hours as needed for pain. Do not exceed 4000 mg of Tylenol daily.  Take ibuprofen with food to avoid upset stomach issues.  2. Treatment: rest, ice 20 minutes at a time 3-4 times daily, elevate when not walking and use brace, drink plenty of fluids, gentle stretching.  Use the knee immobilizer and crutches to help you get around.  You do not have to use them but they can be helpful in alleviating your pain.  Do the attached exercises 3-4 times weekly. 3. Follow Up: Please followup with orthopedics as directed or your PCP in 1 week if no improvement for discussion of your diagnoses and further evaluation after today's visit; if you do not have a primary care doctor use the resource guide provided to find one; Please return to the ER for worsening symptoms or other concerns such as worsening swelling, redness of the skin, fevers, loss of pulses, or loss of feeling

## 2020-07-11 ENCOUNTER — Emergency Department (HOSPITAL_COMMUNITY)
Admission: EM | Admit: 2020-07-11 | Discharge: 2020-07-12 | Disposition: A | Discharge status: Home / Self Care | Payer: BC Managed Care – PPO | Attending: Emergency Medicine | Admitting: Emergency Medicine

## 2020-07-11 ENCOUNTER — Encounter (HOSPITAL_COMMUNITY): Payer: Self-pay | Admitting: Emergency Medicine

## 2020-07-11 ENCOUNTER — Emergency Department (HOSPITAL_COMMUNITY): Payer: BC Managed Care – PPO

## 2020-07-11 ENCOUNTER — Other Ambulatory Visit: Payer: Self-pay

## 2020-07-11 DIAGNOSIS — R072 Precordial pain: Secondary | ICD-10-CM | POA: Diagnosis present

## 2020-07-11 DIAGNOSIS — U071 COVID-19: Secondary | ICD-10-CM | POA: Diagnosis not present

## 2020-07-11 LAB — CBC
HCT: 43.3 % (ref 39.0–52.0)
Hemoglobin: 14.8 g/dL (ref 13.0–17.0)
MCH: 30 pg (ref 26.0–34.0)
MCHC: 34.2 g/dL (ref 30.0–36.0)
MCV: 87.8 fL (ref 80.0–100.0)
Platelets: 274 10*3/uL (ref 150–400)
RBC: 4.93 MIL/uL (ref 4.22–5.81)
RDW: 11.1 % — ABNORMAL LOW (ref 11.5–15.5)
WBC: 3.3 10*3/uL — ABNORMAL LOW (ref 4.0–10.5)
nRBC: 0 % (ref 0.0–0.2)

## 2020-07-11 NOTE — ED Triage Notes (Signed)
Patient reports central chest pain/SOB with dry cough and chest congestion this week , no emesis or diaphoresis .

## 2020-07-12 LAB — BASIC METABOLIC PANEL
Anion gap: 10 (ref 5–15)
BUN: 9 mg/dL (ref 6–20)
CO2: 27 mmol/L (ref 22–32)
Calcium: 8.9 mg/dL (ref 8.9–10.3)
Chloride: 101 mmol/L (ref 98–111)
Creatinine, Ser: 1.33 mg/dL — ABNORMAL HIGH (ref 0.61–1.24)
GFR, Estimated: >60 mL/min (ref >60)
Glucose, Bld: 126 mg/dL — ABNORMAL HIGH (ref 70–99)
Potassium: 3.7 mmol/L (ref 3.5–5.1)
Sodium: 138 mmol/L (ref 135–145)

## 2020-07-12 LAB — RESP PANEL BY RT-PCR (FLU A&B, COVID) ARPGX2
Influenza A by PCR: NEGATIVE
Influenza B by PCR: NEGATIVE
SARS Coronavirus 2 by RT PCR: POSITIVE — AB

## 2020-07-12 LAB — TROPONIN I (HIGH SENSITIVITY)
Troponin I (High Sensitivity): 6 ng/L (ref <18)
Troponin I (High Sensitivity): 6 ng/L (ref <18)

## 2020-07-12 MED ORDER — ONDANSETRON 8 MG PO TBDP: ORAL_TABLET | ORAL | 0 refills | Status: AC

## 2020-07-12 NOTE — ED Notes (Signed)
Patient verbalizes understanding of discharge instructions. Opportunity for questioning and answers were provided. Armband removed by staff, pt discharged from ED via wheelchair.  

## 2020-07-12 NOTE — ED Provider Notes (Signed)
[facility] EMERGENCY DEPARTMENT Provider Note   CSN: [id number] Arrival date & time: 07/11/20  2202     History Chief Complaint  Patient presents with  . Chest Pain    Donald Woodward is a 29 y.o. male.  The history is provided by the patient.  Chest Pain Pain location:  Substernal area and L chest Pain quality: sharp   Pain radiates to:  Does not radiate Pain severity:  Mild Timing:  Constant Chronicity:  New Context: not stress and not trauma   Associated symptoms: cough and fever        No past medical history on file.  There are no problems to display for this patient.   Past Surgical History:  Procedure Laterality Date  . ANTERIOR CRUCIATE LIGAMENT REPAIR         No family history on file.  Social History   Tobacco Use  . Smoking status: Never Smoker  . Smokeless tobacco: Never Used  Substance Use Topics  . Alcohol use: No    Comment: rarely  . Drug use: No    Home Medications Prior to Admission medications   Medication Sig Start Date End Date Taking? Authorizing Provider  dextromethorphan-guaiFENesin (MUCINEX DM) 30-600 MG 12hr tablet Take 1 tablet by mouth 2 (two) times daily as needed for cough.   Yes [provider]  ibuprofen (ADVIL) 800 MG tablet Take 800 mg by mouth every 6 (six) hours as needed for headache or mild pain. 03/27/20  Yes [provider]  ondansetron (ZOFRAN ODT) 8 MG disintegrating tablet 8mg  ODT q4 hours prn nausea 07/12/20   Raymon Schlarb, 07/14/20, MD    Allergies    Patient has no known allergies.  Review of Systems   Review of Systems  Constitutional: Positive for appetite change, chills and fever.  Respiratory: Positive for cough.   Cardiovascular: Positive for chest pain.  All other systems reviewed and are negative.   Physical Exam Updated Vital Signs BP 114/78   Pulse 83   Temp 99.1 F (37.3 C) (Oral)   Resp 20   Ht 5\' 8"  (1.727 m)   Wt 90 kg   SpO2 98%   BMI 30.17 kg/m    Physical Exam Vitals and nursing note reviewed.  Constitutional:      Appearance: He is well-developed and well-nourished.  HENT:     Head: Normocephalic and atraumatic.  Cardiovascular:     Rate and Rhythm: Normal rate.  Pulmonary:     Effort: Pulmonary effort is normal. No respiratory distress.     Breath sounds: Normal breath sounds.  Abdominal:     General: There is no distension.     Palpations: Abdomen is soft.  Musculoskeletal:        General: Normal range of motion.     Cervical back: Normal range of motion.  Skin:    General: Skin is warm and dry.  Neurological:     Mental Status: He is alert.     ED Results / Procedures / Treatments   Labs (all labs ordered are listed, but only abnormal results are displayed) Labs Reviewed  RESP PANEL BY RT-PCR (FLU A&B, COVID) ARPGX2 - Abnormal; Notable for the following components:      Result Value   SARS Coronavirus 2 by RT PCR POSITIVE (*)    All other components within normal limits  BASIC METABOLIC PANEL - Abnormal; Notable for the following components:   Glucose, Bld 126 (*)    Creatinine,  Ser 1.33 (*)    All other components within normal limits  CBC - Abnormal; Notable for the following components:   WBC 3.3 (*)    RDW 11.1 (*)    All other components within normal limits  PROTIME-INR  TROPONIN I (HIGH SENSITIVITY)  TROPONIN I (HIGH SENSITIVITY)    EKG EKG Interpretation  Date/Time:  Saturday July 11 2020 22:39:57 EST Ventricular Rate:  96 PR Interval:  128 QRS Duration: 82 QT Interval:  340 QTC Calculation: 429 R Axis:   77 Text Interpretation: Normal sinus rhythm Normal ECG Confirmed by Marily Memos [phone]) on 07/12/2020 1:39:49 AM   Radiology DG Chest 2 View  Result Date: 07/11/2020 CLINICAL DATA:  Chest pain and shortness of breath. EXAM: CHEST - 2 VIEW COMPARISON:  None. FINDINGS: Mild infiltrates are seen within the bilateral lung bases, left slightly greater than right. There is no  evidence of a pleural effusion or pneumothorax. The heart size and mediastinal contours are within normal limits. The visualized skeletal structures are unremarkable. IMPRESSION: Mild bibasilar infiltrates. Electronically Signed   By: Aram Candela M.D.   On: 07/11/2020 23:06    Procedures Procedures (including critical care time)  Medications Ordered in ED Medications - No data to display  ED Course  I have reviewed the triage vital signs and the nursing notes.  Pertinent labs & imaging results that were available during my care of the patient were reviewed by me and considered in my medical decision making (see chart for details).    MDM Rules/Calculators/A&P                          Overall patient appears well.  Was positive for Covid which explains his symptoms.  Discharge instructions given along with things to do for his Covid at home.  Final Clinical Impression(s) / ED Diagnoses Final diagnoses:  COVID-19    Rx / DC Orders ED Discharge Orders         Ordered    ondansetron (ZOFRAN ODT) 8 MG disintegrating tablet        07/12/20 0353           Viera Okonski, Barbara Cower, MD 07/12/20 0600

## 2020-07-24 ENCOUNTER — Telehealth: Payer: Self-pay | Admitting: Nurse Practitioner

## 2020-07-24 NOTE — Telephone Encounter (Signed)
[facility] called pt LVM for HFU advice & scheduling appt at clinic [phone]).

## 2021-08-14 ENCOUNTER — Other Ambulatory Visit: Payer: Self-pay

## 2021-08-14 ENCOUNTER — Emergency Department (HOSPITAL_COMMUNITY): Payer: Managed Care, Other (non HMO)

## 2021-08-14 ENCOUNTER — Emergency Department (HOSPITAL_COMMUNITY)
Admission: EM | Admit: 2021-08-14 | Discharge: 2021-08-14 | Disposition: A | Discharge status: Home / Self Care | Payer: Managed Care, Other (non HMO) | Attending: Emergency Medicine | Admitting: Emergency Medicine

## 2021-08-14 DIAGNOSIS — Y9229 Other specified public building as the place of occurrence of the external cause: Secondary | ICD-10-CM | POA: Insufficient documentation

## 2021-08-14 DIAGNOSIS — W1839XA Other fall on same level, initial encounter: Secondary | ICD-10-CM | POA: Diagnosis not present

## 2021-08-14 DIAGNOSIS — S4991XA Unspecified injury of right shoulder and upper arm, initial encounter: Secondary | ICD-10-CM

## 2021-08-14 DIAGNOSIS — R202 Paresthesia of skin: Secondary | ICD-10-CM | POA: Insufficient documentation

## 2021-08-14 DIAGNOSIS — Y9361 Activity, american tackle football: Secondary | ICD-10-CM | POA: Diagnosis not present

## 2021-08-14 MED ORDER — KETOROLAC TROMETHAMINE 30 MG/ML IJ SOLN: 30.0000 mg | Freq: Once | INTRAMUSCULAR | 0 refills | Status: AC

## 2021-08-14 MED ORDER — KETOROLAC TROMETHAMINE 30 MG/ML IJ SOLN
30.0000 mg | Freq: Once | INTRAMUSCULAR | Status: AC
  Administered 2021-08-14: 30 mg via INTRAMUSCULAR
  Filled 2021-08-14: qty 1

## 2021-08-14 MED ORDER — IBUPROFEN 600 MG PO TABS: 600.0000 mg | ORAL_TABLET | Freq: Four times a day (QID) | ORAL | 0 refills | Status: AC | PRN

## 2021-08-14 MED ORDER — HYDROCODONE-ACETAMINOPHEN 5-325 MG PO TABS: 1.0000 | ORAL_TABLET | Freq: Four times a day (QID) | ORAL | 0 refills | Status: AC | PRN

## 2021-08-14 NOTE — Discharge Instructions (Addendum)
You have been seen in the emergency department after a football injury to your right shoulder. Your xray images did not reveal a dislocation or fracture. I suspect that you have an AC joint separation or other soft tissue injury. You should follow up with Dr. Dallas Schimke with orthopedic surgery to have this further evaluated. I have prescribed your Hydrocodone for your pain. You can also take ibuprofen and tylenol for your pain as well. Take this as needed and for no longer than one week. Please do not take hydrocodone prior to driving or operating heavy machinery. This could make you drowsy or affect your respiratory drive especially with taking larger amounts. Please take the dose prescribed.

## 2021-08-14 NOTE — ED Provider Notes (Signed)
[facility] EMERGENCY DEPARTMENT Provider Note   CSN: [id number] Arrival date & time: 08/14/21  1251     History  Chief Complaint  Patient presents with   Shoulder Injury    right    Donald Woodward is a 31 y.o. male. Presents the emergency department after a football injury to his right shoulder.  He states he was part of the kickoff team and was running forward and ended up getting tackled pretty hard to the point where he fell on the ground hard onto his right shoulder.  He said immediately, he did not notice any symptoms, however after the adrenaline wore off he started noticing tingling to his right hand that was intermittent.  He also had severe right shoulder pain.  He noticed a knot on his right shoulder area as well as concerned that he had dislocated his shoulder.   Shoulder Injury      Home Medications Prior to Admission medications   Medication Sig Start Date End Date Taking? Authorizing Provider  HYDROcodone-acetaminophen (NORCO/VICODIN) 5-325 MG tablet Take 1 tablet by mouth every 6 (six) hours as needed for up to 5 days. 08/14/21 08/19/21 Yes Arisbeth Purrington, Finis Bud, PA-C  ibuprofen (ADVIL) 600 MG tablet Take 1 tablet (600 mg total) by mouth every 6 (six) hours as needed for up to 7 days for mild pain. 08/14/21 08/21/21 Yes Shakiera Edelson, Finis Bud, PA-C  ketorolac (TORADOL) 30 MG/ML injection Inject 1 mL (30 mg total) into the muscle once for 1 dose. 08/14/21 08/14/21 Yes Suleyman Ehrman, Finis Bud, PA-C  dextromethorphan-guaiFENesin (MUCINEX DM) 30-600 MG 12hr tablet Take 1 tablet by mouth 2 (two) times daily as needed for cough.    [provider]  ondansetron (ZOFRAN ODT) 8 MG disintegrating tablet 8mg  ODT q4 hours prn nausea 07/12/20   Mesner, 07/14/20, MD      Allergies    Patient has no known allergies.    Review of Systems   Review of Systems  Musculoskeletal:  Positive for arthralgias and joint swelling.  Neurological:        Paresthesias  All other systems reviewed and  are negative.  Physical Exam Updated Vital Signs BP 130/81    Pulse 85    Temp 98.5 F (36.9 C) (Oral)    Resp 20    SpO2 99%  Physical Exam Vitals and nursing note reviewed.  Constitutional:      General: He is not in acute distress.    Appearance: Normal appearance. He is well-developed. He is not ill-appearing, toxic-appearing or diaphoretic.  HENT:     Head: Normocephalic and atraumatic.     Nose: No nasal deformity.     Mouth/Throat:     Lips: Pink. No lesions.  Eyes:     General: Gaze aligned appropriately. No scleral icterus.       Right eye: No discharge.        Left eye: No discharge.     Conjunctiva/sclera: Conjunctivae normal.     Right eye: Right conjunctiva is not injected. No exudate or hemorrhage.    Left eye: Left conjunctiva is not injected. No exudate or hemorrhage. Cardiovascular:     Comments: Radial pulse is 2+ bilaterally. Pulmonary:     Effort: Pulmonary effort is normal. No respiratory distress.  Musculoskeletal:       Arms:     Comments: There is a notable deformity or knot that has formed on the right clavicle area close to the [facility] joint. Humerus head itself feels to  be in place.  He does not have any humerus, elbow, forearm, or wrist tenderness to palpation.  He is able to flex/extend/abduct his shoulder to about 90 degrees, however cannot go higher than this.  Skin:    General: Skin is warm and dry.     Comments: Skin temperature distal to the injury appears warm.  Neurological:     Mental Status: He is alert and oriented to person, place, and time.     Comments: Sensation in right upper extremity is intact.  Equal to bilateral extremities.  Psychiatric:        Mood and Affect: Mood normal.        Speech: Speech normal.        Behavior: Behavior normal. Behavior is cooperative.    ED Results / Procedures / Treatments   Labs (all labs ordered are listed, but only abnormal results are displayed) Labs Reviewed - No data to  display  EKG None  Radiology DG Clavicle Right  Result Date: 08/14/2021 CLINICAL DATA:  Right shoulder injury while playing football. EXAM: RIGHT CLAVICLE - 2+ VIEWS COMPARISON:  08/14/2021 at 1:27 p.m. FINDINGS: On the 2 AP views provided, there is no fracture or bone lesion. Glenohumeral and AC joints appear normally aligned. Soft tissues are unremarkable. IMPRESSION: Negative. Electronically Signed   By: Amie Portlandavid  Ormond M.D.   On: 08/14/2021 14:31   DG Shoulder Right Port  Result Date: 08/14/2021 CLINICAL DATA:  Concern for shoulder dislocation EXAM: RIGHT SHOULDER - 1 VIEW COMPARISON:  None. FINDINGS: There is no evidence of fracture or dislocation on this single view. There is no evidence of arthropathy or other focal bone abnormality. Soft tissues are unremarkable. IMPRESSION: Negative limited single view of the right shoulder. Electronically Signed   By: Maudry MayhewJeffrey  Waltz M.D.   On: 08/14/2021 13:38    Procedures Procedures    Medications Ordered in ED Medications  ketorolac (TORADOL) 30 MG/ML injection 30 mg (30 mg Intramuscular Given 08/14/21 1515)    ED Course/ Medical Decision Making/ A&P                           Medical Decision Making Problems Addressed: Right shoulder injury: acute illness or injury  Amount and/or Complexity of Data Reviewed Independent Historian:     Details: Independent historian Radiology: ordered and independent interpretation performed. Decision-making details documented in ED Course.  Risk OTC drugs. Prescription drug management. Parenteral controlled substances.   This is a 31 y.o. male who presents to the ED with right shoulder injury after flying football today. Exam is notable for a knot or deformity on the right distal clavicle close to the [facility] joint.  Shoulder does not feel to be out of place on exam.  He is neurovascularly intact.  Right upper extremity feels warm.  He does have subjective paresthesias that have been intermittent in his  right arm.  Initial x-ray was reviewed and does not indicate any dislocation or acute fracture.  I do not feel that it obtained a good view the area where the deformity is on the clavicle, so will obtain right clavicle x-ray as well.  I reviewed clavicle x-ray and there is no evidence of clavicle fracture.  There is maybe some mild AC separation present.  We will treat this as a soft tissue injury.  Patient will be placed in sling.  Given Toradol here.  Needs to follow-up with orthopedic surgery for further management.  Provided pain  medication prescription.  Return precautions provided.  Portions of this note were generated with Scientist, clinical (histocompatibility and immunogenetics). Dictation errors may occur despite best attempts at proofreading.  Final Clinical Impression(s) / ED Diagnoses Final diagnoses:  Right shoulder injury    Rx / DC Orders ED Discharge Orders          Ordered    ketorolac (TORADOL) 30 MG/ML injection   Once        08/14/21 1441    HYDROcodone-acetaminophen (NORCO/VICODIN) 5-325 MG tablet  Every 6 hours PRN        08/14/21 1448    ibuprofen (ADVIL) 600 MG tablet  Every 6 hours PRN        08/14/21 1452              Haaris Metallo, Kathee Delton 08/14/21 1548    Linwood Dibbles, MD 08/15/21 0730

## 2021-08-14 NOTE — ED Triage Notes (Signed)
Pt reports playing football about 30 minutes ago and now reports right shoulder dislocation. Pt unable to move arm and reports fingertips feel cold

## 2021-08-14 NOTE — ED Notes (Signed)
Pt support person came back to triage and informed this nurse that patient also has a headache. She is unsure of how hard patient got hit during football game.

## 2021-08-24 ENCOUNTER — Encounter: Payer: Self-pay | Admitting: Orthopedic Surgery

## 2021-08-24 ENCOUNTER — Ambulatory Visit: Payer: Managed Care, Other (non HMO) | Admitting: Orthopedic Surgery

## 2021-08-24 ENCOUNTER — Other Ambulatory Visit: Payer: Self-pay

## 2021-08-24 DIAGNOSIS — S43101A Unspecified dislocation of right acromioclavicular joint, initial encounter: Secondary | ICD-10-CM | POA: Diagnosis not present

## 2021-08-24 MED ORDER — IBUPROFEN 800 MG PO TABS: 800.0000 mg | ORAL_TABLET | Freq: Three times a day (TID) | ORAL | 0 refills | Status: DC | PRN

## 2021-08-24 NOTE — Patient Instructions (Addendum)
Please provide a letter for work  No lifting anything heavier than 15 lbs, until f/u in 1 month.  Formal documentation to be faxed to number provided.   Sling as needed  Limit over head motion and lifting.

## 2021-08-25 ENCOUNTER — Encounter: Payer: Self-pay | Admitting: Orthopedic Surgery

## 2021-08-25 NOTE — Progress Notes (Signed)
New Patient Visit  Assessment: Donald Woodward is a 31 y.o. male with the following: 1. Separation of right acromioclavicular joint, initial encounter  Plan: Patient sustained an injury to his right shoulder, while playing football.  He has tenderness directly over the [facility] joint.  On radiographs, there is limited elevation of the clavicle, in relation to the acromion.  As such, this can be treated without surgery.  Continue with the sling as needed.  Limit overhead motion.  No lifting anything greater than 15 pounds.  Documentation was provided for work.  We will see him back in approximately 1 month.   Follow-up: Return in about 4 weeks (around 09/21/2021).  Subjective:  Chief Complaint  Patient presents with   Shoulder Injury    RT/ DOI 08/14/21    History of Present Illness: Donald Woodward is a 31 y.o. male who presents for evaluation of right shoulder injury.  He was playing football, approximately 1 week ago, when he was pushed to the ground.  He landed directly on the lateral aspect of his right shoulder.  He noted immediate pain.  He presented to the emergency department for evaluation.  He has been in a sling since.  He has been taking medications as needed.  He states that ibuprofen, 800 mg is effective for him.  No numbness or tingling.  No prior injuries to his right shoulder.   Review of Systems: No fevers or chills No numbness or tingling No chest pain No shortness of breath No bowel or bladder dysfunction No GI distress No headaches   Medical History:  No past medical history on file.  Past Surgical History:  Procedure Laterality Date   ANTERIOR CRUCIATE LIGAMENT REPAIR      No family history on file. Social History   Tobacco Use   Smoking status: Never   Smokeless tobacco: Never  Substance Use Topics   Alcohol use: No    Comment: rarely   Drug use: No    No Known Allergies  Current Meds  Medication Sig   dextromethorphan-guaiFENesin (MUCINEX  DM) 30-600 MG 12hr tablet Take 1 tablet by mouth 2 (two) times daily as needed for cough.   ibuprofen (ADVIL) 800 MG tablet Take 1 tablet (800 mg total) by mouth every 8 (eight) hours as needed.   ondansetron (ZOFRAN ODT) 8 MG disintegrating tablet 8mg  ODT q4 hours prn nausea    Objective: There were no vitals taken for this visit.  Physical Exam:  General: Alert and oriented. and No acute distress. Gait: Normal gait.  Evaluation of the right shoulder demonstrates a prominence over the [facility] joint.  There is some bruising in this area.  He has tenderness to palpation here.  Abduction is comfortable to approximately 80 degrees.  He can go further, but this causes further pain.  Forward elevation to 100 degrees.  Once again, he can go further, but this causes more severe pain.  Sensation is intact throughout the right hand.  2+ radial pulse.  IMAGING: I personally reviewed images previously obtained from the ED  X-rays of right shoulder were obtained in the emergency department.  There is mild elevation of the clavicle in relation to the acromion.  No other injuries are noted.  New Medications:  Meds ordered this encounter  Medications   ibuprofen (ADVIL) 800 MG tablet    Sig: Take 1 tablet (800 mg total) by mouth every 8 (eight) hours as needed.    Dispense:  90 tablet  Refill:  0      Oliver Barre, MD  08/25/2021 8:15 AM

## 2021-09-21 ENCOUNTER — Ambulatory Visit: Payer: Managed Care, Other (non HMO) | Admitting: Orthopedic Surgery

## 2021-09-22 ENCOUNTER — Ambulatory Visit: Payer: Managed Care, Other (non HMO) | Admitting: Orthopedic Surgery

## 2022-05-24 ENCOUNTER — Encounter (HOSPITAL_COMMUNITY): Payer: Self-pay

## 2022-05-24 ENCOUNTER — Emergency Department (HOSPITAL_COMMUNITY)
Admission: EM | Admit: 2022-05-24 | Discharge: 2022-05-24 | Disposition: A | Discharge status: Home / Self Care | Payer: Worker's Compensation | Attending: Student | Admitting: Student

## 2022-05-24 ENCOUNTER — Other Ambulatory Visit: Payer: Self-pay

## 2022-05-24 DIAGNOSIS — Z23 Encounter for immunization: Secondary | ICD-10-CM | POA: Insufficient documentation

## 2022-05-24 DIAGNOSIS — Y990 Civilian activity done for income or pay: Secondary | ICD-10-CM | POA: Insufficient documentation

## 2022-05-24 DIAGNOSIS — S61012A Laceration without foreign body of left thumb without damage to nail, initial encounter: Secondary | ICD-10-CM | POA: Diagnosis not present

## 2022-05-24 DIAGNOSIS — S6992XA Unspecified injury of left wrist, hand and finger(s), initial encounter: Secondary | ICD-10-CM | POA: Diagnosis present

## 2022-05-24 MED ORDER — LIDOCAINE HCL (PF) 2 % IJ SOLN
10.0000 mL | Freq: Once | INTRAMUSCULAR | Status: AC
  Administered 2022-05-24: 10 mL via INTRADERMAL

## 2022-05-24 MED ORDER — IBUPROFEN 800 MG PO TABS: 800.0000 mg | ORAL_TABLET | Freq: Three times a day (TID) | ORAL | 0 refills | Status: AC

## 2022-05-24 MED ORDER — TETANUS-DIPHTH-ACELL PERTUSSIS 5-2.5-18.5 LF-MCG/0.5 IM SUSY
0.5000 mL | PREFILLED_SYRINGE | Freq: Once | INTRAMUSCULAR | Status: AC
  Administered 2022-05-24: 0.5 mL via INTRAMUSCULAR
  Filled 2022-05-24: qty 0.5

## 2022-05-24 MED ORDER — POVIDONE-IODINE 10 % EX SOLN
CUTANEOUS | Status: DC | PRN
  Filled 2022-05-24: qty 14.8

## 2022-05-24 NOTE — ED Notes (Signed)
Lidocaine, betadine, and lac cart placed at pt bedside.

## 2022-05-24 NOTE — Discharge Instructions (Signed)
Keep the wound clean and bandaged.  Clean with mild soap and water only.  Out in 8 to 10 days.  Return to the emergency department for any signs of wound infection.

## 2022-05-24 NOTE — ED Provider Notes (Signed)
[facility] EMERGENCY DEPARTMENT Provider Note   CSN: [id number] Arrival date & time: 05/24/22  1024     History  Chief Complaint  Patient presents with   Laceration    Donald Woodward is a 31 y.o. male.   Laceration      Donald Woodward is a 31 y.o. male who presents to the Emergency Department complaining of laceration of his left thumb.  This is a work-related injury.  States he cut his thumb while using a box cutter.  Laceration to the proximal thumb.  Bleeding controlled with direct pressure.  He denies any numbness or weakness of his thumb hand or wrist.  Last Td is unknown.  States he will also need a drug screen.      Home Medications Prior to Admission medications   Medication Sig Start Date End Date Taking? Authorizing Provider  dextromethorphan-guaiFENesin (MUCINEX DM) 30-600 MG 12hr tablet Take 1 tablet by mouth 2 (two) times daily as needed for cough.    [provider]  ibuprofen (ADVIL) 800 MG tablet Take 1 tablet (800 mg total) by mouth every 8 (eight) hours as needed. 08/24/21   Mordecai Rasmussen, MD  ondansetron (ZOFRAN ODT) 8 MG disintegrating tablet 8mg  ODT q4 hours prn nausea 07/12/20   Mesner, Corene Cornea, MD      Allergies    Patient has no known allergies.    Review of Systems   Review of Systems  Skin:  Positive for wound. Negative for color change.       Laceration left thumb  Neurological:  Negative for dizziness, weakness, numbness and headaches.    Physical Exam Updated Vital Signs BP (!) 137/100 (BP Location: Right Arm)   Pulse 71   Temp 97.8 F (36.6 C) (Oral)   Resp 16   Ht 5\' 8"  (1.727 m)   Wt 80.7 kg   SpO2 99%   BMI 27.06 kg/m  Physical Exam Vitals and nursing note reviewed.  Constitutional:      General: He is not in acute distress.    Appearance: Normal appearance.  Cardiovascular:     Rate and Rhythm: Normal rate and regular rhythm.     Pulses: Normal pulses.  Pulmonary:     Effort: Pulmonary effort is normal.   Musculoskeletal:        General: Signs of injury present. No swelling or deformity. Normal range of motion.     Left hand: Laceration present. No swelling or deformity. Normal range of motion. Normal strength. Normal sensation. There is no disruption of two-point discrimination. Normal capillary refill. Normal pulse.     Comments: 1.5 cm laceration to the proximal left thumb.  No active bleeding, no hematoma.  No foreign body seen, patient able to perform full range of motion of the left thumb.  Skin:    General: Skin is warm.     Capillary Refill: Capillary refill takes less than 2 seconds.  Neurological:     General: No focal deficit present.     Mental Status: He is alert.     ED Results / Procedures / Treatments   Labs (all labs ordered are listed, but only abnormal results are displayed) Labs Reviewed - No data to display  EKG None  Radiology No results found.  Procedures Procedures     LACERATION REPAIR Performed by: Izzah Pasqua Authorized by: Tammatha Cobb Consent: Verbal consent obtained. Risks and benefits: risks, benefits and alternatives were discussed Consent given by: patient Patient identity confirmed: provided  demographic data Prepped and Draped in normal sterile fashion Wound explored  Laceration Location: left thumb  Laceration Length: 1.5 cm  No Foreign Bodies seen or palpated  Anesthesia: local infiltration  Local anesthetic: lidocaine 1% w/o epinephrine  Anesthetic total: 2 ml  Irrigation method: syringe Amount of cleaning: standard  Skin closure: 4-0 Ethilon  Number of sutures: 3  Technique: simple interrupted  Patient tolerance: Patient tolerated the procedure well with no immediate complications.  Medications Ordered in ED Medications  povidone-iodine (BETADINE) 10 % external solution ( Topical Given 05/24/22 1238)  Tdap (BOOSTRIX) injection 0.5 mL (has no administration in time range)  lidocaine HCl (PF) (XYLOCAINE) 2 %  injection 10 mL (10 mLs Intradermal Given by Other 05/24/22 1238)    ED Course/ Medical Decision Making/ A&P                           Medical Decision Making Patient here for evaluation of laceration to the left thumb.  This was a work-related injury.  Last Td unknown  Wound explored through full range of motion and entire depth of wound.  He has full range of motion of the thumb.  No motor weakness.  No foreign body seen.    Laceration will require suture repair, we will update his tetanus  Amount and/or Complexity of Data Reviewed Discussion of management or test interpretation with external provider(s): Patient tolerated wound repair without difficulty or complications.  Hemostasis obtained prior to wound closure.  Remains neurovascularly intact.  No motor weakness of the thumb.  No obvious injuries of the deep structures.  Td updated, dressing applied.  Patient agreeable to wound care instructions.  Requesting prescription for ibuprofen.  Risk OTC drugs. Prescription drug management.           Final Clinical Impression(s) / ED Diagnoses Final diagnoses:  Laceration of left thumb without foreign body without damage to nail, initial encounter    Rx / DC Orders ED Discharge Orders     None         Pauline Aus, PA-C 05/24/22 1316    Kommor, Numidia, MD 05/25/22 [phone]

## 2022-05-24 NOTE — ED Triage Notes (Signed)
Pt presents to ED with laceration to left thumb after opening box with box cutters. Last Tdap unknown

## 2022-06-01 ENCOUNTER — Encounter (HOSPITAL_COMMUNITY): Payer: Self-pay | Admitting: Emergency Medicine

## 2022-06-01 ENCOUNTER — Other Ambulatory Visit: Payer: Self-pay

## 2022-06-01 ENCOUNTER — Emergency Department (HOSPITAL_COMMUNITY)
Admission: EM | Admit: 2022-06-01 | Discharge: 2022-06-01 | Disposition: A | Discharge status: Home / Self Care | Payer: Worker's Compensation | Attending: Emergency Medicine | Admitting: Emergency Medicine

## 2022-06-01 DIAGNOSIS — Z4802 Encounter for removal of sutures: Secondary | ICD-10-CM | POA: Insufficient documentation

## 2022-06-01 NOTE — ED Triage Notes (Addendum)
Pt here for stitch removal to left thumb. Nad. No ss of infection Workers comp per pt

## 2022-06-01 NOTE — ED Provider Notes (Signed)
[facility] EMERGENCY DEPARTMENT Provider Note   CSN: [id number] Arrival date & time: 06/01/22  1416     History  Chief Complaint  Patient presents with   Suture / Staple Removal    Donald Woodward is a 31 y.o. male   The history is provided by the patient.  Suture / Staple Removal This is a new problem. The current episode started more than 1 week ago. The problem has been rapidly improving. Pertinent negatives include no shortness of breath. Associated symptoms comments: No drainage.No complaints,. Nothing aggravates the symptoms.       Home Medications Prior to Admission medications   Medication Sig Start Date End Date Taking? Authorizing Provider  dextromethorphan-guaiFENesin (MUCINEX DM) 30-600 MG 12hr tablet Take 1 tablet by mouth 2 (two) times daily as needed for cough.    [provider]  ibuprofen (ADVIL) 800 MG tablet Take 1 tablet (800 mg total) by mouth 3 (three) times daily. Take with food 05/24/22   Triplett, Tammy, PA-C  ondansetron (ZOFRAN ODT) 8 MG disintegrating tablet 8mg  ODT q4 hours prn nausea 07/12/20   Mesner, 07/14/20, MD      Allergies    Patient has no known allergies.    Review of Systems   Review of Systems  Constitutional:  Negative for chills and fever.  Respiratory:  Negative for shortness of breath and wheezing.   Skin:  Positive for wound.  Neurological:  Negative for numbness.    Physical Exam Updated Vital Signs BP 129/86 (BP Location: Left Arm)   Pulse 77   Temp 97.8 F (36.6 C) (Oral)   Resp 17   SpO2 97%  Physical Exam Constitutional:      Appearance: He is well-developed.  HENT:     Head: Normocephalic.  Cardiovascular:     Rate and Rhythm: Normal rate.  Pulmonary:     Effort: Pulmonary effort is normal.  Skin:    Findings: Laceration present.     Comments: Well-healed laceration left dorsal thumb at the MCP joint.  No swelling or erythema, no drainage.  Wound edges are well approximated.  Neurological:      Mental Status: He is alert and oriented to person, place, and time.     Sensory: No sensory deficit.     ED Results / Procedures / Treatments   Labs (all labs ordered are listed, but only abnormal results are displayed) Labs Reviewed - No data to display  EKG None  Radiology No results found.  Procedures Procedures    SUTURE REMOVAL Performed by: Barbara Cower  Consent: Verbal consent obtained. Patient identity confirmed: provided demographic data Time out: Immediately prior to procedure a "time out" was called to verify the correct patient, procedure, equipment, support staff and site/side marked as required.  Location details: left thumb  Wound Appearance: clean  Sutures/Staples Removed: 3.  Steri strips applied for a few days of extra support as wound is over a joint space.  Facility: sutures placed in this facility Patient tolerance: Patient tolerated the procedure well with no immediate complications.    Medications Ordered in ED Medications - No data to display  ED Course/ Medical Decision Making/ A&P                           Medical Decision Making          Final Clinical Impression(s) / ED Diagnoses Final diagnoses:  Visit for suture removal    Rx /  DC Orders ED Discharge Orders     None         Victoriano Lain 06/01/22 1536    Tanda Rockers A, DO 06/02/22 1419

## 2022-06-01 NOTE — ED Notes (Signed)
Sutures removed, wound healing appropriately, no sign of inflammation or infection.  One steri-strip placed.

## 2022-06-01 NOTE — Discharge Instructions (Signed)
Allow the sterile strips to fall off on their own (will probably start peeling in 3-4 days). These will give some extra support as the wound continues to heal.

## 2023-06-21 IMAGING — DX DG CLAVICLE*R*
2 series · 2 of 2 positions shown · non-contrast
Comparison: 08/14/2021 at [DATE] p.m.

CLINICAL DATA: Right shoulder injury while playing football.

EXAM:
RIGHT CLAVICLE - 2+ VIEWS

[clavicle ap]
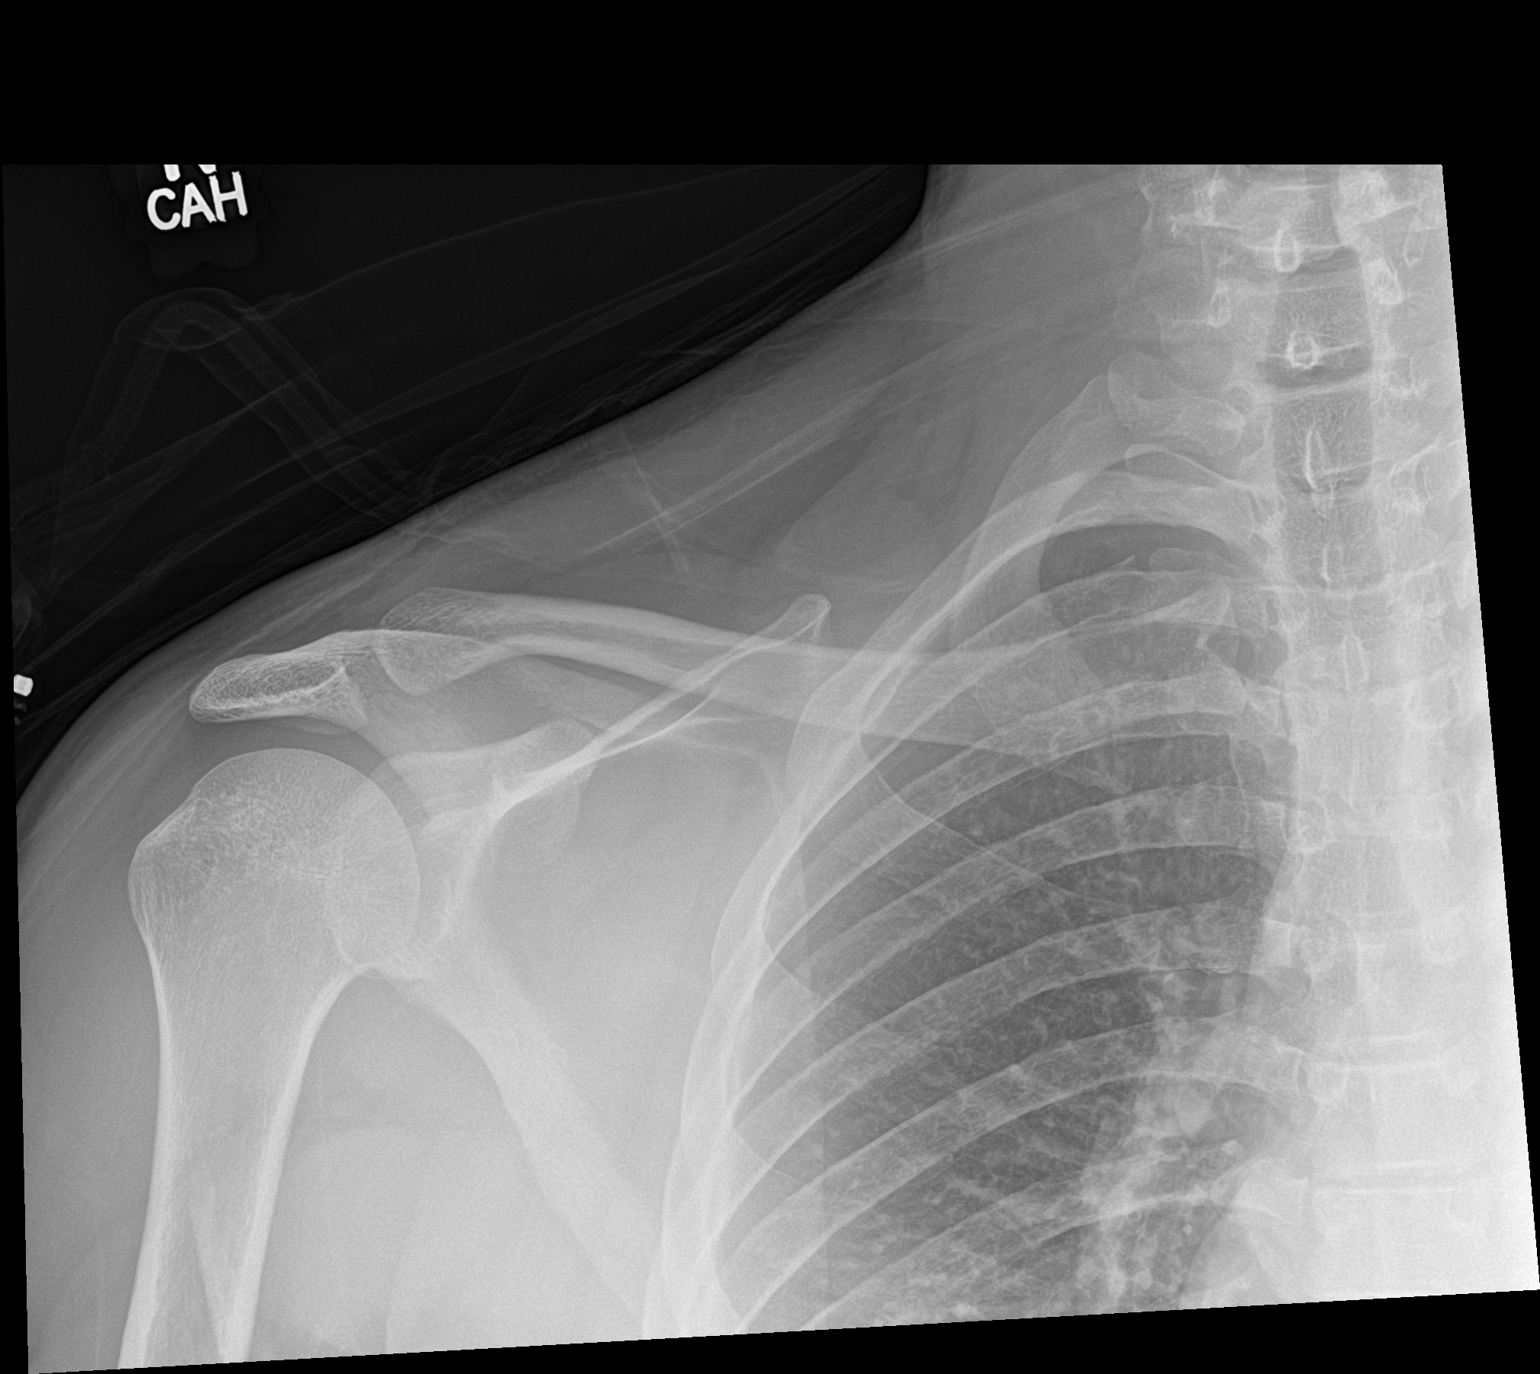

[clavicle axial]
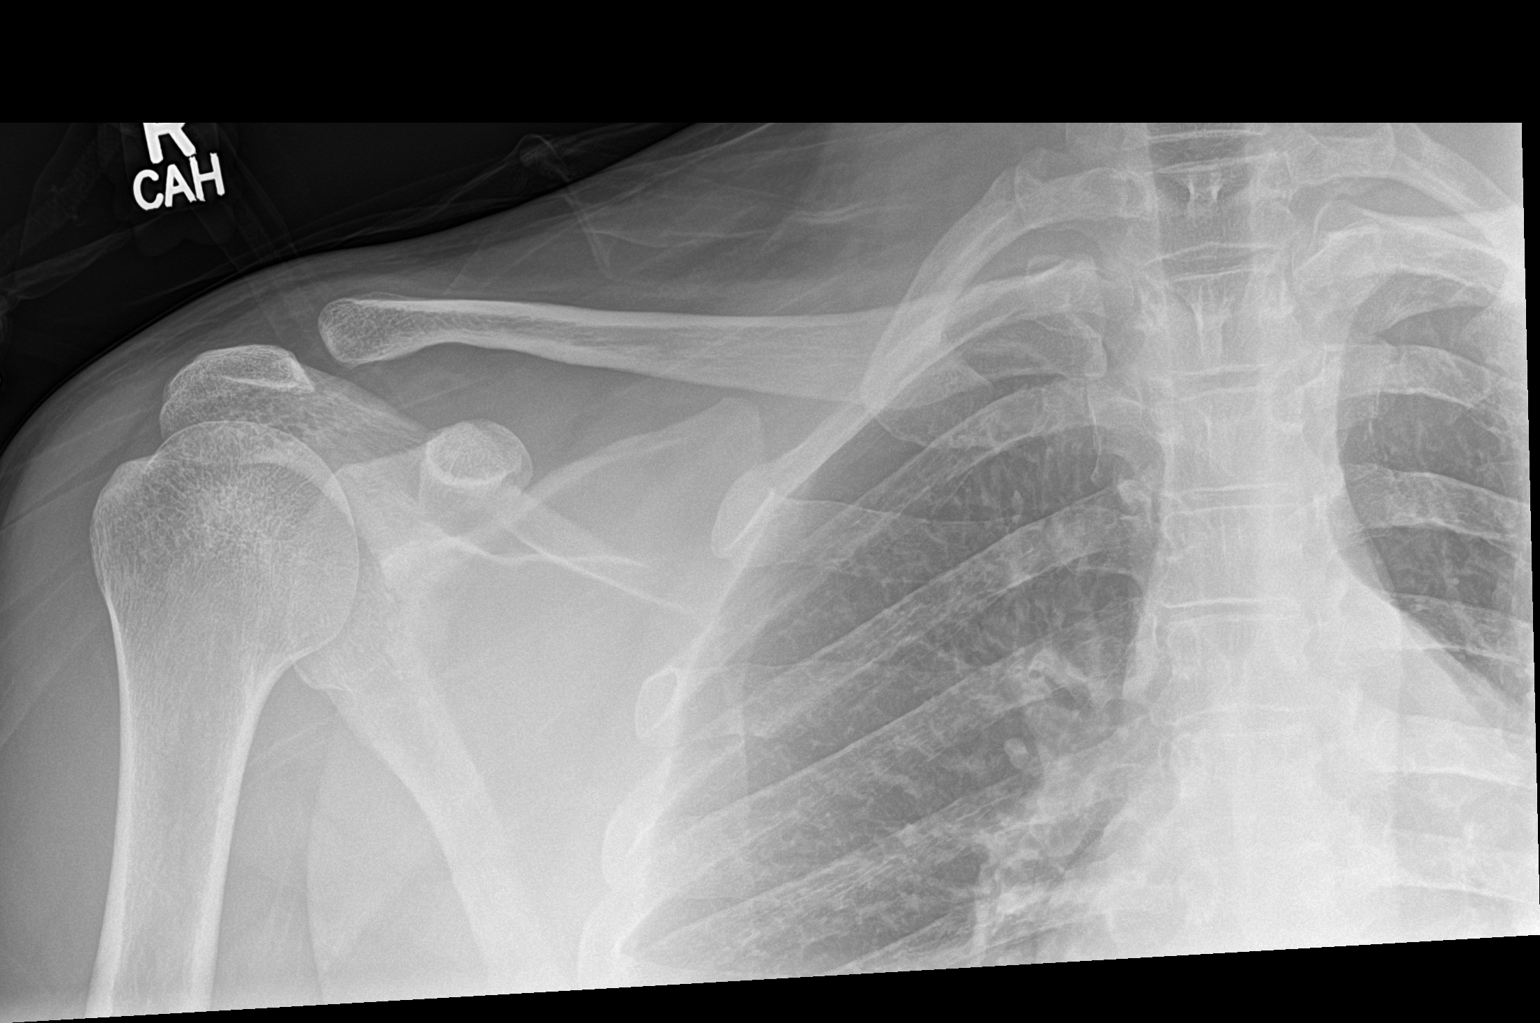

[2 of 2 positions shown; findings below may reference images not displayed]

FINDINGS: On the 2 AP views provided, there is no fracture or bone lesion.
Glenohumeral and AC joints appear normally aligned.

Soft tissues are unremarkable.
IMPRESSION: Negative.

## 2023-06-21 IMAGING — DX DG SHOULDER 1V*R*
1 series · 1 of 1 positions shown · non-contrast
Comparison: None.

CLINICAL DATA: Concern for shoulder dislocation

EXAM:
RIGHT SHOULDER - 1 VIEW

[shoulder obl]
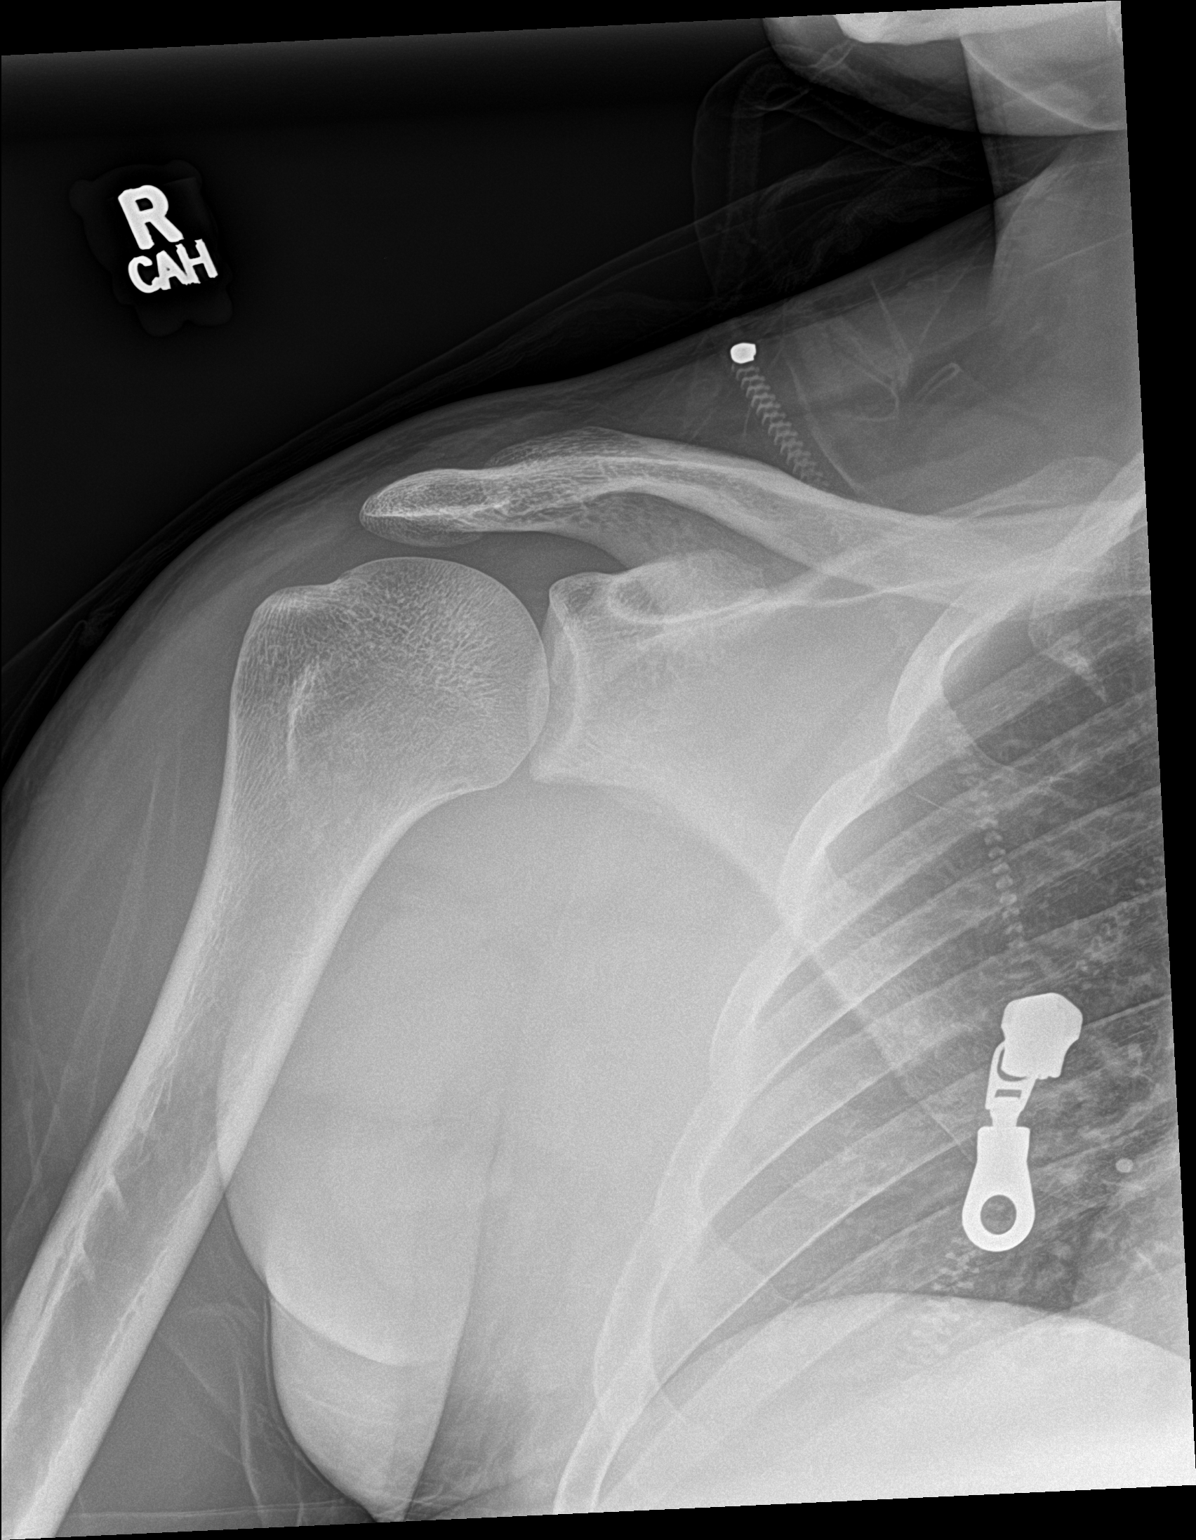

[1 of 1 positions shown; findings below may reference images not displayed]

FINDINGS: There is no evidence of fracture or dislocation on this single view.
There is no evidence of arthropathy or other focal bone abnormality.
Soft tissues are unremarkable.
IMPRESSION: Negative limited single view of the right shoulder.
